# Patient Record
Sex: Male | Born: 1995 | Race: White | Hispanic: No | Marital: Married | Smoking: Never smoker
Health system: Southern US, Community
[De-identification: ages and names within clinical notes are randomized; demographics above are authoritative.]

## PROBLEM LIST (undated history)

## (undated) DIAGNOSIS — J45909 Unspecified asthma, uncomplicated: Secondary | ICD-10-CM

## (undated) DIAGNOSIS — F329 Major depressive disorder, single episode, unspecified: Secondary | ICD-10-CM

## (undated) DIAGNOSIS — F419 Anxiety disorder, unspecified: Secondary | ICD-10-CM

## (undated) DIAGNOSIS — F32A Depression, unspecified: Secondary | ICD-10-CM

## (undated) HISTORY — PX: CIRCUMCISION: SUR203

---
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
## 2017-04-28 ENCOUNTER — Other Ambulatory Visit: Payer: Self-pay

## 2017-04-28 ENCOUNTER — Emergency Department (HOSPITAL_COMMUNITY): Payer: No Typology Code available for payment source

## 2017-04-28 ENCOUNTER — Encounter (HOSPITAL_COMMUNITY): Payer: Self-pay | Admitting: *Deleted

## 2017-04-28 ENCOUNTER — Emergency Department (HOSPITAL_COMMUNITY)
Admission: EM | Admit: 2017-04-28 | Discharge: 2017-04-28 | Disposition: A | Discharge status: Home / Self Care | Payer: No Typology Code available for payment source | Attending: Emergency Medicine | Admitting: Emergency Medicine

## 2017-04-28 DIAGNOSIS — Y929 Unspecified place or not applicable: Secondary | ICD-10-CM | POA: Diagnosis not present

## 2017-04-28 DIAGNOSIS — Y999 Unspecified external cause status: Secondary | ICD-10-CM | POA: Insufficient documentation

## 2017-04-28 DIAGNOSIS — Z79899 Other long term (current) drug therapy: Secondary | ICD-10-CM | POA: Diagnosis not present

## 2017-04-28 DIAGNOSIS — R51 Headache: Secondary | ICD-10-CM | POA: Diagnosis not present

## 2017-04-28 DIAGNOSIS — F419 Anxiety disorder, unspecified: Secondary | ICD-10-CM | POA: Diagnosis not present

## 2017-04-28 DIAGNOSIS — M545 Low back pain: Secondary | ICD-10-CM | POA: Insufficient documentation

## 2017-04-28 DIAGNOSIS — M542 Cervicalgia: Secondary | ICD-10-CM | POA: Diagnosis present

## 2017-04-28 DIAGNOSIS — Y9389 Activity, other specified: Secondary | ICD-10-CM | POA: Diagnosis not present

## 2017-04-28 DIAGNOSIS — R9431 Abnormal electrocardiogram [ECG] [EKG]: Secondary | ICD-10-CM | POA: Diagnosis not present

## 2017-04-28 MED ORDER — METHOCARBAMOL 500 MG PO TABS
750.0000 mg | ORAL_TABLET | Freq: Once | ORAL | Status: AC
  Administered 2017-04-28: 750 mg via ORAL
  Filled 2017-04-28: qty 2

## 2017-04-28 MED ORDER — NAPROXEN 500 MG PO TABS: 500.0000 mg | ORAL_TABLET | Freq: Two times a day (BID) | ORAL | 0 refills | Status: DC

## 2017-04-28 MED ORDER — LORAZEPAM 1 MG PO TABS
1.0000 mg | ORAL_TABLET | Freq: Once | ORAL | Status: AC
  Administered 2017-04-28: 1 mg via ORAL
  Filled 2017-04-28: qty 1

## 2017-04-28 MED ORDER — KETOROLAC TROMETHAMINE 60 MG/2ML IM SOLN
30.0000 mg | Freq: Once | INTRAMUSCULAR | Status: AC
  Administered 2017-04-28: 30 mg via INTRAMUSCULAR
  Filled 2017-04-28: qty 2

## 2017-04-28 MED ORDER — METHOCARBAMOL 500 MG PO TABS: 500.0000 mg | ORAL_TABLET | Freq: Two times a day (BID) | ORAL | 0 refills | Status: DC

## 2017-04-28 NOTE — ED Notes (Signed)
Patient transported to X-Ray 

## 2017-04-28 NOTE — Discharge Instructions (Signed)
Take naprosyn for pain. Robaxin for muscle spasms. Avoid strenuous activity. Follow up with cardiology for abnormal ECG

## 2017-04-28 NOTE — ED Notes (Signed)
Patient transported back from CT 
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
## 2017-05-07 ENCOUNTER — Encounter: Payer: Self-pay | Admitting: Family Medicine

## 2017-05-07 ENCOUNTER — Ambulatory Visit (INDEPENDENT_AMBULATORY_CARE_PROVIDER_SITE_OTHER): Payer: Self-pay | Admitting: Family Medicine

## 2017-05-07 DIAGNOSIS — M542 Cervicalgia: Secondary | ICD-10-CM

## 2017-05-07 DIAGNOSIS — M549 Dorsalgia, unspecified: Secondary | ICD-10-CM
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
## 2017-05-07 NOTE — Patient Instructions (Signed)
Switch to 2 Aleve twice per day when you're Naprosyn runs out. Heat for 20 minutes 3 times per day and then gentle stretching after that

## 2017-05-09 ENCOUNTER — Encounter: Payer: Self-pay | Admitting: Cardiology

## 2017-05-09 ENCOUNTER — Ambulatory Visit (INDEPENDENT_AMBULATORY_CARE_PROVIDER_SITE_OTHER): Payer: Self-pay | Admitting: Cardiology

## 2017-05-09 VITALS — BP 110/60 | HR 76 | Ht 71.0 in | Wt 129.0 lb

## 2017-05-09 DIAGNOSIS — R011 Cardiac murmur, unspecified: Secondary | ICD-10-CM | POA: Insufficient documentation

## 2017-05-09 DIAGNOSIS — R000 Tachycardia, unspecified: Secondary | ICD-10-CM | POA: Insufficient documentation

## 2017-05-09 DIAGNOSIS — S299XXS Unspecified injury of thorax, sequela: Secondary | ICD-10-CM | POA: Insufficient documentation

## 2017-05-09 NOTE — Patient Instructions (Signed)
SCHEDULE AT [address] Your physician has requested that you have an echocardiogram. Echocardiography is a painless test that uses sound waves to create images of your heart. It provides your doctor with information about the size and shape of your heart and how well your heart's chambers and valves are working. This procedure takes approximately one hour. There are no restrictions for this procedure.     Your physician recommends that you schedule a follow-up appointment in 1 MONTH WITH DR [facility]
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
## 2017-05-12 ENCOUNTER — Encounter: Payer: Self-pay | Admitting: Cardiology
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
## 2017-05-23 ENCOUNTER — Ambulatory Visit (HOSPITAL_COMMUNITY): Payer: Self-pay | Attending: Cardiovascular Disease

## 2017-05-23 ENCOUNTER — Other Ambulatory Visit: Payer: Self-pay

## 2017-05-23 DIAGNOSIS — R011 Cardiac murmur, unspecified: Secondary | ICD-10-CM | POA: Insufficient documentation

## 2017-05-23 DIAGNOSIS — X58XXXS Exposure to other specified factors, sequela: Secondary | ICD-10-CM | POA: Insufficient documentation

## 2017-05-23 DIAGNOSIS — R000 Tachycardia, unspecified: Secondary | ICD-10-CM | POA: Insufficient documentation

## 2017-05-23 DIAGNOSIS — S299XXS Unspecified injury of thorax, sequela: Secondary | ICD-10-CM | POA: Insufficient documentation

## 2017-06-14 ENCOUNTER — Ambulatory Visit (INDEPENDENT_AMBULATORY_CARE_PROVIDER_SITE_OTHER): Payer: Self-pay | Admitting: Cardiology

## 2017-06-14 ENCOUNTER — Encounter: Payer: Self-pay | Admitting: Cardiology

## 2017-06-14 VITALS — BP 132/72 | HR 89 | Ht 71.0 in | Wt 125.0 lb

## 2017-06-14 DIAGNOSIS — S299XXS Unspecified injury of thorax, sequela: Secondary | ICD-10-CM

## 2017-06-14 DIAGNOSIS — R011 Cardiac murmur, unspecified: Secondary | ICD-10-CM

## 2017-06-14 DIAGNOSIS — R000 Tachycardia, unspecified: Secondary | ICD-10-CM

## 2017-06-14 NOTE — Progress Notes (Signed)
KEEP HYDRATE

## 2017-06-14 NOTE — Patient Instructions (Signed)
KEEP HYDRATED     IF SYMPTOMS RETURN  CALL OFFICE FOR MONITOR PLACEMENT  AND OFFICE VISIT   Your physician recommends that you schedule a follow-up appointment on an as needed basis.
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
## 2017-06-16 ENCOUNTER — Encounter: Payer: Self-pay | Admitting: Cardiology

## 2017-06-16 NOTE — Assessment & Plan Note (Signed)
No longer tachycardic -- I suspect that he does have some tachycardia related to poor hydration.  Strenuously recommended adequate hydration

## 2017-06-16 NOTE — Assessment & Plan Note (Signed)
Still has some chest wall pain - with deep inspiration. Echo was normal with no sign of cardiac trauma.

## 2017-06-16 NOTE — Assessment & Plan Note (Signed)
Probably a flow murmur, potentially related to dehydration and tachycardia.  Nothing significant found on echo.
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
## 2021-01-11 ENCOUNTER — Emergency Department (HOSPITAL_BASED_OUTPATIENT_CLINIC_OR_DEPARTMENT_OTHER): Payer: Self-pay

## 2021-01-11 ENCOUNTER — Emergency Department (HOSPITAL_BASED_OUTPATIENT_CLINIC_OR_DEPARTMENT_OTHER)
Admission: EM | Admit: 2021-01-11 | Discharge: 2021-01-11 | Disposition: A | Discharge status: Home / Self Care | Payer: Self-pay | Attending: Emergency Medicine | Admitting: Emergency Medicine

## 2021-01-11 ENCOUNTER — Encounter (HOSPITAL_BASED_OUTPATIENT_CLINIC_OR_DEPARTMENT_OTHER): Payer: Self-pay | Admitting: Urology

## 2021-01-11 ENCOUNTER — Other Ambulatory Visit: Payer: Self-pay

## 2021-01-11 DIAGNOSIS — F17200 Nicotine dependence, unspecified, uncomplicated: Secondary | ICD-10-CM | POA: Insufficient documentation

## 2021-01-11 DIAGNOSIS — F1010 Alcohol abuse, uncomplicated: Secondary | ICD-10-CM | POA: Insufficient documentation

## 2021-01-11 DIAGNOSIS — R002 Palpitations: Secondary | ICD-10-CM | POA: Insufficient documentation

## 2021-01-11 DIAGNOSIS — R0789 Other chest pain: Secondary | ICD-10-CM | POA: Insufficient documentation

## 2021-01-11 DIAGNOSIS — Y900 Blood alcohol level of less than 20 mg/100 ml: Secondary | ICD-10-CM | POA: Insufficient documentation

## 2021-01-11 DIAGNOSIS — R0602 Shortness of breath: Secondary | ICD-10-CM | POA: Insufficient documentation

## 2021-01-11 LAB — CBC
HCT: 42.4 % (ref 39.0–52.0)
Hemoglobin: 15.1 g/dL (ref 13.0–17.0)
MCH: 32.6 pg (ref 26.0–34.0)
MCHC: 35.6 g/dL (ref 30.0–36.0)
MCV: 91.6 fL (ref 80.0–100.0)
Platelets: 269 10*3/uL (ref 150–400)
RBC: 4.63 MIL/uL (ref 4.22–5.81)
RDW: 12.3 % (ref 11.5–15.5)
WBC: 6.3 10*3/uL (ref 4.0–10.5)
nRBC: 0 % (ref 0.0–0.2)

## 2021-01-11 LAB — COMPREHENSIVE METABOLIC PANEL
ALT: 16 U/L (ref 0–44)
AST: 19 U/L (ref 15–41)
Albumin: 4 g/dL (ref 3.5–5.0)
Alkaline Phosphatase: 60 U/L (ref 38–126)
Anion gap: 8 (ref 5–15)
BUN: 9 mg/dL (ref 6–20)
CO2: 27 mmol/L (ref 22–32)
Calcium: 9 mg/dL (ref 8.9–10.3)
Chloride: 102 mmol/L (ref 98–111)
Creatinine, Ser: 0.83 mg/dL (ref 0.61–1.24)
GFR, Estimated: >60 mL/min (ref >60)
Glucose, Bld: 107 mg/dL — ABNORMAL HIGH (ref 70–99)
Potassium: 3.4 mmol/L — ABNORMAL LOW (ref 3.5–5.1)
Sodium: 137 mmol/L (ref 135–145)
Total Bilirubin: 0.5 mg/dL (ref 0.3–1.2)
Total Protein: 6.7 g/dL (ref 6.5–8.1)

## 2021-01-11 LAB — TSH: TSH: 1.026 u[IU]/mL (ref 0.350–4.500)

## 2021-01-11 LAB — TROPONIN I (HIGH SENSITIVITY): Troponin I (High Sensitivity): <2 ng/L (ref <18)

## 2021-01-11 LAB — LIPASE, BLOOD: Lipase: 36 U/L (ref 11–51)

## 2021-01-11 LAB — ETHANOL: Alcohol, Ethyl (B): <10 mg/dL (ref <10)

## 2021-01-11 MED ORDER — ALUM & MAG HYDROXIDE-SIMETH 200-200-20 MG/5ML PO SUSP
30.0000 mL | Freq: Once | ORAL | Status: AC
  Administered 2021-01-11: 30 mL via ORAL
  Filled 2021-01-11: qty 30

## 2021-01-11 MED ORDER — FAMOTIDINE 20 MG PO TABS
40.0000 mg | ORAL_TABLET | Freq: Once | ORAL | Status: AC
  Administered 2021-01-11: 40 mg via ORAL
  Filled 2021-01-11: qty 2

## 2021-01-11 MED ORDER — LIDOCAINE VISCOUS HCL 2 % MT SOLN
15.0000 mL | Freq: Once | OROMUCOSAL | Status: AC
  Administered 2021-01-11: 15 mL via ORAL
  Filled 2021-01-11: qty 15

## 2021-01-11 MED ORDER — OMEPRAZOLE 40 MG PO CPDR: 40.0000 mg | DELAYED_RELEASE_CAPSULE | Freq: Every day | ORAL | 0 refills | Status: DC

## 2021-01-11 MED ORDER — ACETAMINOPHEN 500 MG PO TABS
1000.0000 mg | ORAL_TABLET | Freq: Once | ORAL | Status: AC
  Administered 2021-01-11: 1000 mg via ORAL
  Filled 2021-01-11: qty 2
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
## 2021-01-11 NOTE — ED Notes (Signed)
Pt on monitor and vitals cycling 

## 2021-01-11 NOTE — ED Notes (Signed)
Portable Xray at bedside.

## 2021-01-11 NOTE — Discharge Instructions (Addendum)
You were seen in the emergency department for chest pain  Lab work, imaging today did not reveal any abnormalities.  We gave you medicines for your stomach and eventually your pain improved.  The cause of your pain is still unclear.  I do not think this is related to coronary artery disease or primary cardiac problem.  Your pain may be from a GI or stomach cause especially because certain drinks will worsen your pain.  Please start taking 40 mg of omeprazole every morning on an empty stomach 30 to 60 minutes before eating or drinking.  This is an antiacid medicine.  Avoid drinking or eating anything irritating to the stomach such as ibuprofen, aspirin, alcohol, marijuana as these may cause nausea, vomiting and abdominal pain.  Occasionally, acid reflux, gastritis or stomach ulcer those will cause radiating pain into the chest, nausea  Consider cutting back and stopping alcohol consumption.    Please return to ED if: Your chest pain is worse or on exertion You have a cough that gets worse, or you cough up blood. You have severe pain in chest, back or abdomen. You have chest pain with loss of sensation, weakness or tingling in extremities You have chest pain or shortness of breath with exertion or activity You have sudden, unexplained discomfort in your chest, with radiation arms, back, neck, or jaw. You suddenly have chest pain and begin to sweat, or your skin gets clammy. You feel chest pain with nausea or vomiting, blood in vomit You suddenly feel light-headed or faint. Your heart begins to beat quickly, or it feels like it is skipping beats. You have one sided leg swelling or calf pain You have chest pain with fever, chills, cough or other viral symptoms

## 2021-01-11 NOTE — ED Triage Notes (Signed)
Intermittent chest pain and palpitations since this morning.  History of same type of episodes over past month.

## 2021-01-13 ENCOUNTER — Encounter: Payer: Self-pay | Admitting: Family Medicine

## 2021-01-13 ENCOUNTER — Ambulatory Visit (INDEPENDENT_AMBULATORY_CARE_PROVIDER_SITE_OTHER): Payer: Self-pay | Admitting: Family Medicine

## 2021-01-13 ENCOUNTER — Other Ambulatory Visit: Payer: Self-pay

## 2021-01-13 VITALS — BP 140/80 | HR 80 | Resp 18 | Ht 70.0 in | Wt 148.4 lb

## 2021-01-13 DIAGNOSIS — F32A Depression, unspecified: Secondary | ICD-10-CM

## 2021-01-13 DIAGNOSIS — F419 Anxiety disorder, unspecified: Secondary | ICD-10-CM

## 2021-01-13 DIAGNOSIS — F1010 Alcohol abuse, uncomplicated: Secondary | ICD-10-CM

## 2021-01-13 DIAGNOSIS — R10816 Epigastric abdominal tenderness: Secondary | ICD-10-CM

## 2021-01-13 DIAGNOSIS — R1013 Epigastric pain: Secondary | ICD-10-CM

## 2021-01-13 DIAGNOSIS — F1210 Cannabis abuse, uncomplicated: Secondary | ICD-10-CM

## 2021-01-13 DIAGNOSIS — R0789 Other chest pain: Secondary | ICD-10-CM

## 2021-01-13 NOTE — Patient Instructions (Addendum)
You will hear from [facility]   Continue taking omeprazole daily.   Avoid alcohol and stop marijuana.   Call and schedule with a cardiologist as we discussed   Call [facility] for conseling [phone] [address] [place]
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
## 2021-01-14 LAB — COMPREHENSIVE METABOLIC PANEL
ALT: 16 IU/L (ref 0–44)
AST: 15 IU/L (ref 0–40)
Albumin/Globulin Ratio: 1.8 (ref 1.2–2.2)
Albumin: 4.4 g/dL (ref 4.1–5.2)
Alkaline Phosphatase: 88 IU/L (ref 44–121)
BUN/Creatinine Ratio: 9 (ref 9–20)
BUN: 8 mg/dL (ref 6–20)
Bilirubin Total: 0.3 mg/dL (ref 0.0–1.2)
CO2: 25 mmol/L (ref 20–29)
Calcium: 9 mg/dL (ref 8.7–10.2)
Chloride: 102 mmol/L (ref 96–106)
Creatinine, Ser: 0.93 mg/dL (ref 0.76–1.27)
Globulin, Total: 2.5 g/dL (ref 1.5–4.5)
Glucose: 96 mg/dL (ref 65–99)
Potassium: 4.8 mmol/L (ref 3.5–5.2)
Sodium: 138 mmol/L (ref 134–144)
Total Protein: 6.9 g/dL (ref 6.0–8.5)
eGFR: 119 mL/min/{1.73_m2} (ref >59)

## 2021-01-14 LAB — CBC WITH DIFFERENTIAL/PLATELET
Basophils Absolute: 0 10*3/uL (ref 0.0–0.2)
Basos: 0 %
EOS (ABSOLUTE): 0.2 10*3/uL (ref 0.0–0.4)
Eos: 3 %
Hematocrit: 45 % (ref 37.5–51.0)
Hemoglobin: 15.5 g/dL (ref 13.0–17.7)
Immature Grans (Abs): 0 10*3/uL (ref 0.0–0.1)
Immature Granulocytes: 0 %
Lymphocytes Absolute: 2.4 10*3/uL (ref 0.7–3.1)
Lymphs: 37 %
MCH: 32.1 pg (ref 26.6–33.0)
MCHC: 34.4 g/dL (ref 31.5–35.7)
MCV: 93 fL (ref 79–97)
Monocytes Absolute: 0.4 10*3/uL (ref 0.1–0.9)
Monocytes: 6 %
Neutrophils Absolute: 3.5 10*3/uL (ref 1.4–7.0)
Neutrophils: 54 %
Platelets: 282 10*3/uL (ref 150–450)
RBC: 4.83 x10E6/uL (ref 4.14–5.80)
RDW: 12.4 % (ref 11.6–15.4)
WBC: 6.6 10*3/uL (ref 3.4–10.8)

## 2021-01-14 LAB — AMYLASE: Amylase: 58 U/L (ref 31–110)

## 2021-01-14 LAB — LIPASE: Lipase: 31 U/L (ref 13–78)

## 2021-01-31 ENCOUNTER — Other Ambulatory Visit: Payer: Self-pay

## 2021-02-10 ENCOUNTER — Ambulatory Visit: Payer: Self-pay | Admitting: Physician Assistant

## 2021-05-11 ENCOUNTER — Encounter: Payer: Self-pay | Admitting: Family Medicine

## 2021-05-11 ENCOUNTER — Other Ambulatory Visit: Payer: Self-pay

## 2021-05-11 ENCOUNTER — Telehealth (INDEPENDENT_AMBULATORY_CARE_PROVIDER_SITE_OTHER): Payer: Self-pay | Admitting: Family Medicine

## 2021-05-11 VITALS — HR 91 | Ht 70.0 in | Wt 153.0 lb

## 2021-05-11 DIAGNOSIS — H6691 Otitis media, unspecified, right ear: Secondary | ICD-10-CM

## 2021-05-11 MED ORDER — AMOXICILLIN 875 MG PO TABS: 875.0000 mg | ORAL_TABLET | Freq: Two times a day (BID) | ORAL | 0 refills | Status: AC

## 2021-05-11 NOTE — Progress Notes (Signed)
   Subjective:  Documentation for virtual audio and video telecommunications through [place] encounter:  The patient was located at work. 2 patient identifiers used.  The provider was located in the office. The patient did consent to this visit and is aware of possible charges through their insurance for this visit.  The other persons participating in this telemedicine service were none. Time spent on call was 15 minutes and in review of previous records >20 minutes total.  This virtual service is not related to other E/M service within previous 7 days.   Patient ID: Ryan [name], male    DOB: August 19, 1996, 25 y.o.   MRN: [id number]  HPI Chief Complaint  Patient presents with   other    Rt. Ear pain maybe blockage can't really hear out of it. For 1 week, runny nose,    Complains of right ear pain, pressure and feeling clogged for the past week. Also complains of rhinorrhea. Denies fever, chills, headache, dizziness, nasal congestion, sinus pain, postnasal drainage, sore throat, cough. Denies recent swimming or submersion of his ears.  Using at home ear drops which helps some.  He has taken ibuprofen.   Hx of ear infections and tubes as a young child.   Reviewed allergies, medications, past medical, surgical, family, and social history.    Review of Systems Pertinent positives and negatives in the history of present illness.     Objective:   Physical Exam Pulse 91   Ht 5\' 10"  (1.778 m)   Wt 153 lb (69.4 kg)   BMI 21.95 kg/m   In no acute distress.  Respirations unlabored.      Assessment & Plan:  Right acute otitis media - Plan: amoxicillin (AMOXIL) 875 MG tablet  I will treat him for acute otitis media.  Recommend he continue taking ibuprofen or over-the-counter pain medication for the next day or 2.  He may continue with over-the-counter drops as well.  Avoid swimming.  Follow-up if worsening or any new symptoms arise or if he is not back to baseline after  completing the antibiotics.

## 2021-06-02 ENCOUNTER — Other Ambulatory Visit (INDEPENDENT_AMBULATORY_CARE_PROVIDER_SITE_OTHER): Payer: Self-pay

## 2021-06-02 ENCOUNTER — Other Ambulatory Visit: Payer: Self-pay

## 2021-06-02 ENCOUNTER — Encounter: Payer: Self-pay | Admitting: *Deleted

## 2021-06-02 ENCOUNTER — Encounter: Payer: Self-pay | Admitting: Family Medicine

## 2021-06-02 ENCOUNTER — Telehealth (INDEPENDENT_AMBULATORY_CARE_PROVIDER_SITE_OTHER): Payer: Self-pay | Admitting: Family Medicine

## 2021-06-02 VITALS — Temp 101.1°F | Ht 71.0 in | Wt 150.0 lb

## 2021-06-02 DIAGNOSIS — R062 Wheezing: Secondary | ICD-10-CM

## 2021-06-02 DIAGNOSIS — U071 COVID-19: Secondary | ICD-10-CM

## 2021-06-02 LAB — POCT INFLUENZA A/B
Influenza A, POC: NEGATIVE
Influenza B, POC: NEGATIVE

## 2021-06-02 LAB — POC COVID19 BINAXNOW: SARS Coronavirus 2 Ag: POSITIVE — AB

## 2021-06-02 MED ORDER — ALBUTEROL SULFATE HFA 108 (90 BASE) MCG/ACT IN AERS: 2.0000 | INHALATION_SPRAY | Freq: Four times a day (QID) | RESPIRATORY_TRACT | 0 refills | Status: DC | PRN

## 2021-06-02 MED ORDER — NIRMATRELVIR/RITONAVIR (PAXLOVID)TABLET: 3.0000 | ORAL_TABLET | Freq: Two times a day (BID) | ORAL | 0 refills | Status: AC

## 2021-06-02 NOTE — Progress Notes (Signed)
Start time: 2:52 End time: 3:14  Virtual Visit via Video Note  I connected with Danise Mina on 06/02/21 by a video enabled telemedicine application and verified that I am speaking with the correct person using two identifiers.  Location: Patient: outside in his driveway Provider: office   I discussed the limitations of evaluation and management by telemedicine and the availability of in person appointments. The patient expressed understanding and agreed to proceed.  History of Present Illness:  Chief Complaint  Patient presents with   Cough    VIRTUAL cough, ST and runny nose. Fever over the last two days-up and down. Body aches. All other symptoms started about 5 days ago. No covid tests have been taken.   5 days ago started with scratchy throat, cough, runny nose In the last couple of days he hs gotten worse-- He noticed at times that he couldn't feel his fingertips or toes He feels very drained when he moves, no energy.  Just started checking temp yesterday, was 101.7. Had felt hot prior to that, about 3-4 days ago. +headaches at forehead and R side of his head.  Nasal drainage is clear. Coughs up black, some yellow mixed in, thick. Thinks the black may come from his daily marijuana smoking.  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 Feels tight in the mornings, like when he had asthma as a child.  Smokes marijuana, daily--not as much in the last couple of days, makes his breathing worse.  No h/o seasonal allergies.  +sick contacts at work, flu and COVID. Not wearing masks at work.  He has taken tylenol for headache, no other OTC medications.  He hasn't had any COVID vaccines  PMH, PSH, SH reviewed H/o childhood asthma  Outpatient Encounter Medications as of 06/02/2021  Medication Sig   omeprazole (PRILOSEC) 20 MG capsule Take 20 mg by mouth daily.   [DISCONTINUED] chlorhexidine (PERIDEX) 0.12 %  solution SMARTSIG:By Mouth (Patient not taking: No sig reported)   [DISCONTINUED] omeprazole (PRILOSEC) 40 MG capsule Take 1 capsule (40 mg total) by mouth daily. Take every morning on an empty stomach 30-60 min before eating or drinking   No facility-administered encounter medications on file as of 06/02/2021.   No Known Allergies  ROS:  fever, URI symptoms per HPI. No vomiting or diarrhea, some nausea. No rashes. No loss of taste/smell. Some chest pain--ongoing and evaluated in the past, not new.   Observations/Objective:  Temp (!) 101.1 F (38.4 C) (Temporal)   Ht 5\' 11"  (1.803 m)   Wt 150 lb (68 kg)   BMI 20.92 kg/m   Well-appearing male, speaking comfortably, in no distress. No coughing during visit. He is alert, oriented. Exam is limited due to virtual nature of the visit.  COVID rapid test +  Assessment and Plan:  COVID-19 virus infection - unvaccinated, h/o childhood asthma with some wheezing. Treat with paxlovid - Plan: nirmatrelvir/ritonavir EUA (PAXLOVID) 20 x 150 MG & 10 x 100MG  TABS  Wheezing - Plan: albuterol (VENTOLIN HFA) 108 (90 Base) MCG/ACT inhaler  Work note for today was given when he came for test.  Add'l note to be written for a week off after + COVID test back. Reviewed supportive care, and to f/u if sx persist/worsen.   Follow Up Instructions:    I discussed the assessment and treatment plan with the patient. The patient was provided an opportunity to ask questions and all were answered. The  patient agreed with the plan and demonstrated an understanding of the instructions.   The patient was advised to call back or seek an in-person evaluation if the symptoms worsen or if the condition fails to improve as anticipated.  I spent 24 minutes dedicated to the care of this patient, including pre-visit review of records, face to face time, post-visit ordering of testing and documentation.    Lavonda Jumbo, MD

## 2021-06-02 NOTE — Patient Instructions (Signed)
Your COVID test was positive. Take the paxlovid twice daily for 5 days.  Start today, since you are supposed to start it within 5 days of the onset of symptoms.  Drink plenty of fluid (water). Continue tylenol or ibuprofen as needed for fever and pain. Use Mucinex DM or Robitussin DM as needed for cough and chest congestion Use the albuterol inhaler every 4-6 hours if needed for wheezing or shortness of breath.   Please contact us if you are needing this very frequently, and not improving, or other new symptoms or concerns develop.

## 2021-06-10 ENCOUNTER — Encounter: Payer: Self-pay | Admitting: Family Medicine

## 2021-06-10 ENCOUNTER — Telehealth: Payer: Self-pay

## 2021-06-10 NOTE — Telephone Encounter (Signed)
Pt. Emailed note to be out of work until Monday and he was told to call back Monday if he is still having symptoms to scheduled virtual apt if needed.

## 2021-06-10 NOTE — Telephone Encounter (Signed)
Pt. Called stating that his note to return to work had him out a week. He stated that you told him he needed to be out 10 days if he was still having symptoms. He said he still has a temperature between 100-101 cough and congestion. He wanted to know if he could get an updated note stating he could be out of work the full 10 days since he still has symptoms. He said his boss was trying to fire him because he did not come in today.

## 2021-06-15 ENCOUNTER — Telehealth (INDEPENDENT_AMBULATORY_CARE_PROVIDER_SITE_OTHER): Payer: Self-pay | Admitting: Family Medicine

## 2021-06-15 ENCOUNTER — Other Ambulatory Visit: Payer: Self-pay

## 2021-06-15 ENCOUNTER — Encounter: Payer: Self-pay | Admitting: Family Medicine

## 2021-06-15 VITALS — Temp 100.6°F | Wt 150.0 lb

## 2021-06-15 DIAGNOSIS — U071 COVID-19: Secondary | ICD-10-CM

## 2021-06-15 DIAGNOSIS — J40 Bronchitis, not specified as acute or chronic: Secondary | ICD-10-CM

## 2021-06-15 MED ORDER — AZITHROMYCIN 500 MG PO TABS: 500.0000 mg | ORAL_TABLET | Freq: Every day | ORAL | 0 refills | Status: DC

## 2021-06-15 NOTE — Progress Notes (Signed)
   Subjective:    Patient ID: Ryan [name], male    DOB: 12/18/1995, 25 y.o.   MRN: [id number]  HPI Documentation for virtual audio and video telecommunications through [place] encounter:  The patient was located at home. 2 patient identifiers used.  The provider was located in the office. The patient did consent to this visit and is aware of possible charges through their insurance for this visit. The other persons participating in this telemedicine service were none. Time spent on call was 5 minutes and in review of previous records >15 minutes total for counseling and coordination of care. This virtual service is not related to other E/M service within previous 7 days.  He was diagnosed with COVID last week and was seen with a virtual visit on 922.  That note was reviewed .  He continues have difficulty with cough, sore throat, headache and fever most recently to 100.6.  The coughing is productive.  He has not taken anything for this.  Review of Systems     Objective:   Physical Exam Alert and in no distress but does look slightly fatigued.       Assessment & Plan:  Bronchitis - Plan: azithromycin (ZITHROMAX) 500 MG tablet  COVID-19 virus infection I explained that the antibiotic works for at least a week.  I will give him a note for him to return to work on Tuesday however I want him to call me Monday to ensure that he is indeed feeling better.  Explained that we might need to give another Dosepak of the azithromycin if he still having trouble after we

## 2021-11-01 ENCOUNTER — Telehealth: Payer: Self-pay | Admitting: Family Medicine

## 2021-11-01 NOTE — Telephone Encounter (Signed)
Dismissal letter in guarantor snapshot  °

## 2022-02-19 ENCOUNTER — Other Ambulatory Visit: Payer: Self-pay

## 2022-02-19 ENCOUNTER — Emergency Department (HOSPITAL_COMMUNITY): Payer: Self-pay

## 2022-02-19 ENCOUNTER — Emergency Department (HOSPITAL_BASED_OUTPATIENT_CLINIC_OR_DEPARTMENT_OTHER)
Admission: EM | Admit: 2022-02-19 | Discharge: 2022-02-19 | Disposition: A | Discharge status: Home / Self Care | Payer: Self-pay | Attending: Emergency Medicine | Admitting: Emergency Medicine

## 2022-02-19 ENCOUNTER — Encounter (HOSPITAL_BASED_OUTPATIENT_CLINIC_OR_DEPARTMENT_OTHER): Payer: Self-pay | Admitting: Obstetrics and Gynecology

## 2022-02-19 DIAGNOSIS — H538 Other visual disturbances: Secondary | ICD-10-CM | POA: Insufficient documentation

## 2022-02-19 DIAGNOSIS — R519 Headache, unspecified: Secondary | ICD-10-CM | POA: Insufficient documentation

## 2022-02-19 DIAGNOSIS — R200 Anesthesia of skin: Secondary | ICD-10-CM | POA: Insufficient documentation

## 2022-02-19 DIAGNOSIS — R202 Paresthesia of skin: Secondary | ICD-10-CM | POA: Insufficient documentation

## 2022-02-19 HISTORY — DX: Unspecified asthma, uncomplicated: J45.909

## 2022-02-19 LAB — CBC WITH DIFFERENTIAL/PLATELET
Abs Immature Granulocytes: 0.01 10*3/uL (ref 0.00–0.07)
Basophils Absolute: 0 10*3/uL (ref 0.0–0.1)
Basophils Relative: 0 %
Eosinophils Absolute: 0.1 10*3/uL (ref 0.0–0.5)
Eosinophils Relative: 2 %
HCT: 43.5 % (ref 39.0–52.0)
Hemoglobin: 15 g/dL (ref 13.0–17.0)
Immature Granulocytes: 0 %
Lymphocytes Relative: 30 %
Lymphs Abs: 2 10*3/uL (ref 0.7–4.0)
MCH: 31.5 pg (ref 26.0–34.0)
MCHC: 34.5 g/dL (ref 30.0–36.0)
MCV: 91.4 fL (ref 80.0–100.0)
Monocytes Absolute: 0.6 10*3/uL (ref 0.1–1.0)
Monocytes Relative: 8 %
Neutro Abs: 4 10*3/uL (ref 1.7–7.7)
Neutrophils Relative %: 60 %
Platelets: 254 10*3/uL (ref 150–400)
RBC: 4.76 MIL/uL (ref 4.22–5.81)
RDW: 12.4 % (ref 11.5–15.5)
WBC: 6.7 10*3/uL (ref 4.0–10.5)
nRBC: 0 % (ref 0.0–0.2)

## 2022-02-19 LAB — BASIC METABOLIC PANEL
Anion gap: 9 (ref 5–15)
BUN: 8 mg/dL (ref 6–20)
CO2: 28 mmol/L (ref 22–32)
Calcium: 10 mg/dL (ref 8.9–10.3)
Chloride: 104 mmol/L (ref 98–111)
Creatinine, Ser: 0.8 mg/dL (ref 0.61–1.24)
GFR, Estimated: >60 mL/min (ref >60)
Glucose, Bld: 99 mg/dL (ref 70–99)
Potassium: 4.2 mmol/L (ref 3.5–5.1)
Sodium: 141 mmol/L (ref 135–145)

## 2022-02-19 MED ORDER — GADOBUTROL 1 MMOL/ML IV SOLN
6.5000 mL | Freq: Once | INTRAVENOUS | Status: AC | PRN
  Administered 2022-02-19: 6.5 mL via INTRAVENOUS

## 2022-02-19 MED ORDER — ONDANSETRON HCL 4 MG/2ML IJ SOLN
4.0000 mg | Freq: Once | INTRAMUSCULAR | Status: AC
  Administered 2022-02-19: 4 mg via INTRAVENOUS
  Filled 2022-02-19: qty 2

## 2022-02-19 MED ORDER — SODIUM CHLORIDE 0.9 % IV BOLUS
1000.0000 mL | Freq: Once | INTRAVENOUS | Status: AC
  Administered 2022-02-19: 1000 mL via INTRAVENOUS

## 2022-02-19 NOTE — ED Provider Notes (Signed)
Ryan [name]   CSN: [id number] Arrival date & time: 02/19/22  [name]     History  Chief Complaint  Patient presents with   Blurred Vision    Ryan [name] is a 26 y.o. male.  Presented to the emergency department due to concern for multiple different complaints.  Patient states around 9 PM he started having numb/tingling sensation in his body, primarily in his lower legs, starting from around his knee down on both sides as well as both of his hands.  This seems to be relatively constant.  Has had generalized decreased energy levels but no focal weakness anywhere.  He also states that he has had periodic episodes of blurry vision.  States that he feels like everything in his vision gets blurry except what he is focused on, this lasts for a couple of minutes and then resolves.  He currently does not have any visual complaints at this time.  He has had a mild headache.  Not worst headache of his life and the headache was not sudden onset.  He denies any chills or fevers no neck pain or stiffness.  HPI     Home Medications Prior to Admission medications   Medication Sig Start Date End Date Taking? Authorizing Provider  albuterol (VENTOLIN HFA) 108 (90 Base) MCG/ACT inhaler Inhale 2 puffs into the lungs every 6 (six) hours as needed for wheezing or shortness of breath. 06/02/21   Rita Ohara, MD  azithromycin (ZITHROMAX) 500 MG tablet Take 1 tablet (500 mg total) by mouth daily. 06/15/21   Denita Lung, MD  omeprazole (PRILOSEC) 20 MG capsule Take 20 mg by mouth daily.    [provider]      Allergies    Patient has no known allergies.    Review of Systems   Review of Systems  Constitutional:  Negative for chills and fever.  HENT:  Negative for ear pain and sore throat.   Eyes:  Negative for pain and visual disturbance.  Respiratory:  Negative for cough and shortness of breath.   Cardiovascular:  Negative for chest pain and  palpitations.  Gastrointestinal:  Negative for abdominal pain and vomiting.  Genitourinary:  Negative for dysuria and hematuria.  Musculoskeletal:  Negative for arthralgias and back pain.  Skin:  Negative for color change and rash.  Neurological:  Positive for numbness and headaches. Negative for seizures and syncope.  All other systems reviewed and are negative.   Physical Exam Updated Vital Signs BP 139/85   Pulse 77   Temp 97.8 F (36.6 C) (Oral)   Resp 16   Ht 5\' 10"  (1.778 m)   Wt 61.2 kg   SpO2 100%   BMI 19.37 kg/m  Physical Exam Vitals and nursing [name] reviewed.  Constitutional:      General: He is not in acute distress.    Appearance: He is well-developed.  HENT:     Head: Normocephalic and atraumatic.  Eyes:     Conjunctiva/sclera: Conjunctivae normal.  Cardiovascular:     Rate and Rhythm: Normal rate and regular rhythm.     Heart sounds: No murmur heard. Pulmonary:     Effort: Pulmonary effort is normal. No respiratory distress.     Breath sounds: Normal breath sounds.  Abdominal:     Palpations: Abdomen is soft.     Tenderness: There is no abdominal tenderness.  Musculoskeletal:        General: No swelling.     Cervical back:  Neck supple.  Skin:    General: Skin is warm and dry.     Capillary Refill: Capillary refill takes less than 2 seconds.  Neurological:     Mental Status: He is alert.     Comments: AAOx3 CN 2-12 intact, speech clear visual fields intact 5/5 strength in b/l UE and LE Patient is able to sense light touch throughout all of his extremities.  He states that it feels slightly different when light touch is applied to both of his lower legs Normal FNF Normal gait Patellar reflexes bilaterally are normal  Psychiatric:        Mood and Affect: Mood normal.     ED Results / Procedures / Treatments   Labs (all labs ordered are listed, but only abnormal results are displayed) Labs Reviewed  CBC WITH DIFFERENTIAL/PLATELET  BASIC  METABOLIC PANEL    EKG None  Radiology No results found.  Procedures Procedures    Medications Ordered in ED Medications  sodium chloride 0.9 % bolus 1,000 mL (1,000 mLs Intravenous New Bag/Given 02/19/22 0941)    ED Course/ Medical Decision Making/ A&P                           Medical Decision Making Amount and/or Complexity of Data Reviewed Labs: ordered. Radiology: ordered.   26 year old man presents to ER for numb/tingling sensation in his lower legs, hands and periodic vision changes.  Currently no visual complaints, his neurologic exam was normal except he did state that sensation to light touch seemed slightly different in his lower legs.  I discussed patient's presentation with Dr. Parke Simmers on call for neuro to get thoughts regarding workup choice -she recommended checking electrolytes first and correcting any electrolyte abnormalities.  If patient continuing to complain of these sensory changes then could consider MRI C-spine with and without contrast to check for atypical MS type presentation.  If symptoms resolving then advanced imaging not needed.  Check basic labs, provided patient with some fluids.  His labs are normal, he has no electrolyte derangement.  Reassessed patient, he denies any improvement in his symptoms, still experiencing the numb/tingling sensation in both of his legs and hands/upper arms.  I discussed the case with Dr. Dennis Bast at [facility].  He will accept as transfer ER to ER for MRI.  Patient's wife at bedside, can transport patient.  Given patient's well appearance, feel he is stable to go POV at this time.  Instructed to go directly to [facility] ER.        Final Clinical Impression(s) / ED Diagnoses Final diagnoses:  Numbness and tingling in both hands  Numbness and tingling of both legs    Rx / DC Orders ED Discharge Orders     None         Lucrezia Starch, MD 02/19/22 1035

## 2022-02-19 NOTE — ED Provider Notes (Signed)
   Patient seen after prior EDP  MR imaging obtained is without significant acute pathology  Case discussed again with neurology.  Dr. Tempie Hoist feels the patient would be best served with outpatient neurology follow-up.  Patient is reassured by results of ED evaluation and work-up.  He does understand need for close outpatient follow-up.  Strict return precautions given and understood   Wynetta Fines, MD 02/19/22 2030

## 2022-02-19 NOTE — Discharge Instructions (Signed)
Return for any problem.  ?

## 2022-02-19 NOTE — ED Triage Notes (Signed)
Patient reports to the ER for blurred vision since 9pm last night. Patient reports his vision will go in and out and when his vision returns he has black spots. Patient reports he also feels like his body is "like static". Patient reports he has been having headaches that are worse that normal.

## 2022-02-19 NOTE — ED Notes (Signed)
Pt transported to MRI 

## 2022-02-19 NOTE — ED Notes (Signed)
Pt departs ED. Pt AxOx4. VSS. Significant other and patient verbalized that they will drive directly to [facility].

## 2022-05-25 ENCOUNTER — Encounter (HOSPITAL_BASED_OUTPATIENT_CLINIC_OR_DEPARTMENT_OTHER): Payer: Self-pay | Admitting: Emergency Medicine

## 2022-05-25 ENCOUNTER — Emergency Department (HOSPITAL_BASED_OUTPATIENT_CLINIC_OR_DEPARTMENT_OTHER)
Admission: EM | Admit: 2022-05-25 | Discharge: 2022-05-25 | Disposition: A | Discharge status: Home / Self Care | Payer: Self-pay | Attending: Emergency Medicine | Admitting: Emergency Medicine

## 2022-05-25 ENCOUNTER — Emergency Department (HOSPITAL_BASED_OUTPATIENT_CLINIC_OR_DEPARTMENT_OTHER): Payer: Self-pay

## 2022-05-25 DIAGNOSIS — J209 Acute bronchitis, unspecified: Secondary | ICD-10-CM | POA: Insufficient documentation

## 2022-05-25 DIAGNOSIS — Z20822 Contact with and (suspected) exposure to covid-19: Secondary | ICD-10-CM | POA: Insufficient documentation

## 2022-05-25 DIAGNOSIS — J40 Bronchitis, not specified as acute or chronic: Secondary | ICD-10-CM

## 2022-05-25 LAB — RESP PANEL BY RT-PCR (FLU A&B, COVID) ARPGX2
Influenza A by PCR: NEGATIVE
Influenza B by PCR: NEGATIVE
SARS Coronavirus 2 by RT PCR: NEGATIVE

## 2022-05-25 MED ORDER — AZITHROMYCIN 500 MG PO TABS: 500.0000 mg | ORAL_TABLET | Freq: Every day | ORAL | 0 refills | Status: DC

## 2022-05-25 NOTE — Discharge Instructions (Addendum)
You were seen in the emergency department for congestion and cough fatigue.  Your chest x-ray did not show any pneumonia and your COVID and flu test were negative.  We are treating you with an antibiotic for possible bronchitis.  Please drink plenty of fluids and rest.  Return to the emergency department if any worsening or concerning symptoms

## 2022-05-25 NOTE — ED Provider Notes (Signed)
[facility] EMERGENCY DEPARTMENT Provider Note   CSN: [id number] Arrival date & time: 05/25/22  0746     History  Chief Complaint  Patient presents with   Cough    Ryan [name] is a 26 y.o. male.  He has no significant past medical history.  He has been sick with cough congestion fatigue starting 2 days ago.  He is not sure if he has had a fever.  He has tried nothing for his symptoms.  His daughter was sick a few days ago.  He was also at the beach before that.  He said he was sent home from work and he is unable to return unless he has a note.  Smokes marijuana denies tobacco.  No nausea vomiting diarrhea.  The history is provided by the patient.  Cough Cough characteristics:  Productive Sputum characteristics:  Yellow Severity:  Moderate Onset quality:  Gradual Duration:  2 days Timing:  Intermittent Progression:  Unchanged Chronicity:  New Smoker: yes   Context: sick contacts   Relieved by:  None tried Worsened by:  Nothing Ineffective treatments:  None tried Associated symptoms: rhinorrhea   Associated symptoms: no chest pain, no ear pain, no fever, no rash and no wheezing   Risk factors: recent travel        Home Medications Prior to Admission medications   Medication Sig Start Date End Date Taking? Authorizing Provider  albuterol (VENTOLIN HFA) 108 (90 Base) MCG/ACT inhaler Inhale 2 puffs into the lungs every 6 (six) hours as needed for wheezing or shortness of breath. 06/02/21   Joselyn Arrow, MD  azithromycin (ZITHROMAX) 500 MG tablet Take 1 tablet (500 mg total) by mouth daily. 06/15/21   Ronnald Nian, MD  omeprazole (PRILOSEC) 20 MG capsule Take 20 mg by mouth daily.    [provider]      Allergies    Patient has no known allergies.    Review of Systems   Review of Systems  Constitutional:  Positive for fatigue. Negative for fever.  HENT:  Positive for rhinorrhea. Negative for ear pain.   Eyes:  Negative for visual  disturbance.  Respiratory:  Positive for cough. Negative for wheezing.   Cardiovascular:  Negative for chest pain.  Gastrointestinal:  Negative for nausea and vomiting.  Skin:  Negative for rash.    Physical Exam Updated Vital Signs BP (!) 136/90 (BP Location: Right Arm)   Pulse 79   Temp 97.7 F (36.5 C) (Oral)   Resp 18   Ht 5\' 10"  (1.778 m)   Wt 65.8 kg   SpO2 100%   BMI 20.81 kg/m  Physical Exam Vitals and nursing note reviewed.  Constitutional:      General: He is not in acute distress.    Appearance: Normal appearance. He is well-developed.  HENT:     Head: Normocephalic and atraumatic.     Right Ear: Tympanic membrane normal.     Left Ear: Tympanic membrane normal.     Nose: Rhinorrhea present.     Mouth/Throat:     Mouth: Mucous membranes are moist.     Pharynx: Oropharynx is clear. No oropharyngeal exudate.  Eyes:     Conjunctiva/sclera: Conjunctivae normal.  Cardiovascular:     Rate and Rhythm: Normal rate and regular rhythm.     Heart sounds: No murmur heard. Pulmonary:     Effort: Pulmonary effort is normal. No respiratory distress.     Breath sounds: Normal breath sounds.  Abdominal:  Palpations: Abdomen is soft.     Tenderness: There is no abdominal tenderness.  Musculoskeletal:     Cervical back: Neck supple.     Right lower leg: No edema.     Left lower leg: No edema.  Skin:    General: Skin is warm and dry.     Capillary Refill: Capillary refill takes less than 2 seconds.  Neurological:     General: No focal deficit present.     Mental Status: He is alert.     ED Results / Procedures / Treatments   Labs (all labs ordered are listed, but only abnormal results are displayed) Labs Reviewed  RESP PANEL BY RT-PCR (FLU A&B, COVID) [id number]    EKG None  Radiology DG Chest Port 1 View  Result Date: 05/25/2022 CLINICAL DATA:  Cough, congestion EXAM: PORTABLE CHEST 1 VIEW COMPARISON:  01/11/2021 FINDINGS: The heart size and mediastinal  contours are within normal limits. Both lungs are clear. The visualized skeletal structures are unremarkable. IMPRESSION: No active disease. Electronically Signed   By: Elige Ko M.D.   On: 05/25/2022 08:23    Procedures Procedures    Medications Ordered in ED Medications - No data to display  ED Course/ Medical Decision Making/ A&P Clinical Course as of 05/25/22 0940  Thu May 25, 2022  0827 Chest x-ray interpreted by me as no acute infiltrates.  Awaiting radiology reading. [MB]    Clinical Course User Index [MB] Terrilee Files, MD                           Medical Decision Making Amount and/or Complexity of Data Reviewed Radiology: ordered.  Risk Prescription drug management.  Ryan [name] was evaluated in Emergency Department on 05/25/2022 for the symptoms described in the history of present illness. He was evaluated in the context of the global COVID-19 pandemic, which necessitated consideration that the patient might be at risk for infection with the SARS-CoV-2 virus that causes COVID-19. Institutional protocols and algorithms that pertain to the evaluation of patients at risk for COVID-19 are in a state of rapid change based on information released by regulatory bodies including the CDC and federal and state organizations. These policies and algorithms were followed during the patient's care in the ED. 26 year old male here with cough fatigue congestion.  Lungs sound clear and saturations 100% on room air.  Afebrile here.  Clinically well-appearing.  COVID and flu testing negative and chest x-ray does not show any acute infiltrates.  Differential includes COVID, flu, bronchitis, pneumonia, viral syndrome.  Will cover with antibiotics for possible bacterial bronchitis.  Return instructions discussed.         Final Clinical Impression(s) / ED Diagnoses Final diagnoses:  Acute bronchitis, unspecified organism    Rx / DC Orders ED Discharge Orders           Ordered    azithromycin (ZITHROMAX) 500 MG tablet  Daily        05/25/22 0846              Terrilee Files, MD 05/25/22 [phone]

## 2022-05-25 NOTE — ED Triage Notes (Signed)
Having a cough, congestion and runny nose, having prod cough of sputum of clear to dark [place], thick sputum. Onset this past Tuesday.

## 2022-10-04 ENCOUNTER — Ambulatory Visit
Admission: EM | Admit: 2022-10-04 | Discharge: 2022-10-04 | Disposition: A | Discharge status: Home / Self Care | Payer: Medicaid Other | Attending: Urgent Care | Admitting: Urgent Care

## 2022-10-04 ENCOUNTER — Ambulatory Visit (INDEPENDENT_AMBULATORY_CARE_PROVIDER_SITE_OTHER): Payer: Medicaid Other

## 2022-10-04 DIAGNOSIS — M542 Cervicalgia: Secondary | ICD-10-CM | POA: Diagnosis not present

## 2022-10-04 DIAGNOSIS — R0782 Intercostal pain: Secondary | ICD-10-CM

## 2022-10-04 DIAGNOSIS — R0781 Pleurodynia: Secondary | ICD-10-CM | POA: Diagnosis not present

## 2022-10-04 DIAGNOSIS — M546 Pain in thoracic spine: Secondary | ICD-10-CM | POA: Diagnosis not present

## 2022-10-04 MED ORDER — TIZANIDINE HCL 4 MG PO TABS: 4.0000 mg | ORAL_TABLET | Freq: Every day | ORAL | 0 refills | Status: DC

## 2022-10-04 MED ORDER — NAPROXEN 500 MG PO TABS: 500.0000 mg | ORAL_TABLET | Freq: Two times a day (BID) | ORAL | 0 refills | Status: DC
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
## 2022-12-06 ENCOUNTER — Ambulatory Visit
Admission: EM | Admit: 2022-12-06 | Discharge: 2022-12-06 | Disposition: A | Discharge status: Home / Self Care | Payer: Medicaid Other | Attending: Internal Medicine | Admitting: Internal Medicine

## 2022-12-06 ENCOUNTER — Ambulatory Visit (INDEPENDENT_AMBULATORY_CARE_PROVIDER_SITE_OTHER): Payer: Medicaid Other

## 2022-12-06 ENCOUNTER — Encounter: Payer: Self-pay | Admitting: Emergency Medicine

## 2022-12-06 DIAGNOSIS — J0110 Acute frontal sinusitis, unspecified: Secondary | ICD-10-CM

## 2022-12-06 DIAGNOSIS — R051 Acute cough: Secondary | ICD-10-CM

## 2022-12-06 DIAGNOSIS — J029 Acute pharyngitis, unspecified: Secondary | ICD-10-CM | POA: Diagnosis not present

## 2022-12-06 LAB — POCT RAPID STREP A (OFFICE): Rapid Strep A Screen: NEGATIVE

## 2022-12-06 LAB — POCT INFLUENZA A/B
Influenza A, POC: NEGATIVE
Influenza B, POC: NEGATIVE

## 2022-12-06 MED ORDER — FLUTICASONE PROPIONATE 50 MCG/ACT NA SUSP: 1.0000 | Freq: Every day | NASAL | 0 refills | Status: DC

## 2022-12-06 MED ORDER — ACETAMINOPHEN 325 MG PO TABS
650.0000 mg | ORAL_TABLET | Freq: Once | ORAL | Status: AC
  Administered 2022-12-06: 650 mg via ORAL

## 2022-12-06 MED ORDER — AMOXICILLIN-POT CLAVULANATE 875-125 MG PO TABS: 1.0000 | ORAL_TABLET | Freq: Two times a day (BID) | ORAL | 0 refills | Status: AC

## 2022-12-06 MED ORDER — BENZONATATE 200 MG PO CAPS: 200.0000 mg | ORAL_CAPSULE | Freq: Three times a day (TID) | ORAL | 0 refills | Status: DC | PRN

## 2022-12-06 NOTE — ED Provider Notes (Signed)
[facility]    CSN: [id number] Arrival date & time: 12/06/22  1616      History   Chief Complaint Chief Complaint  Patient presents with   Fever   Dizziness   Sore Throat   Cough   Generalized Body Aches    HPI Ryan [name] is a 27 y.o. male  presents for evaluation of URI symptoms for 5 days. Patient reports associated symptoms of subjective fevers, cough, body aches, sore throat, headache, dizziness, nausea and vomiting. Denies N/V/D, shortness of breath, rashes. Patient does have a hx of asthma as a child but outgrew it.  He quit smoking marijuana last month.  No known sick contacts.  Pt has taken cold/cough medicine OTC for symptoms. Pt has no other concerns at this time.    Fever Associated symptoms: congestion, cough, headaches, myalgias, nausea, sore throat and vomiting   Dizziness Associated symptoms: headaches, nausea and vomiting   Sore Throat Associated symptoms include headaches.  Cough Associated symptoms: fever, headaches, myalgias and sore throat     Past Medical History:  Diagnosis Date   Anxiety    Asthma    Depression     Patient Active Problem List   Diagnosis Date Noted   Chest wall pain 01/13/2021   Epigastric abdominal pain 01/13/2021   Epigastric abdominal tenderness without rebound tenderness 01/13/2021   Alcohol abuse 01/13/2021   Marijuana abuse 01/13/2021   Anxiety and depression [id number]   Systolic murmur [id number]   Sinus tachycardia by electrocardiogram 05/09/2017   Chest wall trauma, sequela 05/09/2017    Past Surgical History:  Procedure Laterality Date   CIRCUMCISION  1999       Home Medications    Prior to Admission medications   Medication Sig Start Date End Date Taking? Authorizing Provider  amoxicillin-clavulanate (AUGMENTIN) 875-125 MG tablet Take 1 tablet by mouth every 12 (twelve) hours for 10 days. 12/06/22 12/16/22 Yes Melynda Ripple, NP  benzonatate (TESSALON) 200 MG capsule Take 1 capsule  (200 mg total) by mouth 3 (three) times daily as needed for cough. 12/06/22  Yes Melynda Ripple, NP  fluticasone (FLONASE) 50 MCG/ACT nasal spray Place 1 spray into both nostrils daily. 12/06/22  Yes Melynda Ripple, NP  naproxen (NAPROSYN) 500 MG tablet Take 1 tablet (500 mg total) by mouth 2 (two) times daily with a meal. 10/04/22   Jaynee Eagles, PA-C  tiZANidine (ZANAFLEX) 4 MG tablet Take 1 tablet (4 mg total) by mouth at bedtime. 10/04/22   Jaynee Eagles, PA-C    Family History Family History  Problem Relation Age of Onset   Heart murmur Mother    Clotting disorder Father    Other Maternal Grandmother        Died at 12 due to blood clots   Diabetes Maternal Grandmother    Clotting disorder Paternal Grandfather        blood clot   Heart failure Paternal Grandfather    Heart attack Paternal Aunt     Social History Social History   Tobacco Use   Smoking status: Never    Passive exposure: Past   Smokeless tobacco: Never   Tobacco comments:    Marijuana  Vaping Use   Vaping Use: Never used  Substance Use Topics   Alcohol use: Yes    Comment: daily   Drug use: Yes    Types: Marijuana     Allergies   Patient has no known allergies.   Review of Systems Review of  Systems  Constitutional:  Positive for fever.  HENT:  Positive for congestion and sore throat.   Respiratory:  Positive for cough.   Gastrointestinal:  Positive for nausea and vomiting.  Musculoskeletal:  Positive for myalgias.  Neurological:  Positive for dizziness and headaches.     Physical Exam Triage Vital Signs ED Triage Vitals  Enc Vitals Group     BP 12/06/22 1636 131/78     Pulse Rate 12/06/22 1636 (!) 106     Resp 12/06/22 1636 18     Temp 12/06/22 1636 (!) 102.8 F (39.3 C)     Temp Source 12/06/22 1636 Oral     SpO2 12/06/22 1636 96 %     Weight --      Height --      Head Circumference --      Peak Flow --      Pain Score 12/06/22 1635 9     Pain Loc --      Pain Edu? --      Excl. in  [place]? --    No data found.  Updated Vital Signs BP 131/78 (BP Location: Right Arm)   Pulse (!) 106   Temp (!) 102.8 F (39.3 C) (Oral)   Resp 18   SpO2 96%   Visual Acuity Right Eye Distance:   Left Eye Distance:   Bilateral Distance:    Right Eye Near:   Left Eye Near:    Bilateral Near:     Physical Exam Vitals and nursing note reviewed.  Constitutional:      General: He is not in acute distress.    Appearance: Normal appearance. He is not ill-appearing or toxic-appearing.  HENT:     Head: Normocephalic and atraumatic.     Right Ear: Tympanic membrane and ear canal normal.     Left Ear: Tympanic membrane and ear canal normal.     Nose: Congestion present.     Right Turbinates: Pale.     Left Turbinates: Pale.     Right Sinus: Frontal sinus tenderness present.     Left Sinus: Frontal sinus tenderness present.     Mouth/Throat:     Mouth: Mucous membranes are moist.     Pharynx: Posterior oropharyngeal erythema present.  Eyes:     Pupils: Pupils are equal, round, and reactive to light.  Cardiovascular:     Rate and Rhythm: Regular rhythm. Tachycardia present.     Heart sounds: Normal heart sounds.     Comments: Mildly tacky at 106 in setting of fever Pulmonary:     Effort: Pulmonary effort is normal.     Breath sounds: Normal breath sounds.  Musculoskeletal:     Cervical back: Normal range of motion and neck supple.  Lymphadenopathy:     Cervical: No cervical adenopathy.  Skin:    General: Skin is warm and dry.     Findings: No rash.  Neurological:     General: No focal deficit present.     Mental Status: He is alert and oriented to person, place, and time.  Psychiatric:        Mood and Affect: Mood normal.        Behavior: Behavior normal.      UC Treatments / Results  Labs (all labs ordered are listed, but only abnormal results are displayed) Labs Reviewed  POCT INFLUENZA A/B  POCT RAPID STREP A (OFFICE)    EKG   Radiology DG Chest 2  View  Result Date: 12/06/2022  CLINICAL DATA:  Cough, fever. EXAM: CHEST - 2 VIEW COMPARISON:  Chest radiograph dated 10/04/2022. FINDINGS: The heart size and mediastinal contours are within normal limits. Both lungs are clear. The visualized skeletal structures are unremarkable. IMPRESSION: No active cardiopulmonary disease. Electronically Signed   By: Zerita Boers M.D.   On: 12/06/2022 17:20    Procedures Procedures (including critical care time)  Medications Ordered in UC Medications  acetaminophen (TYLENOL) tablet 650 mg (650 mg Oral Given 12/06/22 1643)    Initial Impression / Assessment and Plan / UC Course  I have reviewed the triage vital signs and the nursing notes.  Pertinent labs & imaging results that were available during my care of the patient were reviewed by me and considered in my medical decision making (see chart for details).     Patient given Tylenol in clinic for fever. Negative rapid flu and rapid strep.  Chest x-ray unremarkable Will cover for sinus infection given high fever and exam Augmentin twice daily for 10 days Flonase daily Tessalon as needed for cough PCP follow-up in 2 days for recheck ER precautions reviewed and patient verbalized understanding Final Clinical Impressions(s) / UC Diagnoses   Final diagnoses:  Acute cough  Sore throat  Acute frontal sinusitis, recurrence not specified     Discharge Instructions      Augmentin twice daily for 10 days Flonase daily Tessalon as needed for cough Over-the-counter Tylenol ibuprofen as needed for fever Rest and fluids Follow-up with your PCP if your symptoms do not improve Please go to emergency room for any worsening symptoms     ED Prescriptions     Medication Sig Dispense Auth. Provider   amoxicillin-clavulanate (AUGMENTIN) 875-125 MG tablet Take 1 tablet by mouth every 12 (twelve) hours for 10 days. 20 tablet Melynda Ripple, NP   benzonatate (TESSALON) 200 MG capsule Take 1 capsule  (200 mg total) by mouth 3 (three) times daily as needed for cough. 20 capsule Melynda Ripple, NP   fluticasone (FLONASE) 50 MCG/ACT nasal spray Place 1 spray into both nostrils daily. 15.8 mL Melynda Ripple, NP      PDMP not reviewed this encounter.   Melynda Ripple, NP 12/06/22 [phone]

## 2022-12-06 NOTE — ED Triage Notes (Signed)
Pt reports generalized body aches, a cough, sore throat, lightheadedness since Friday and noticed a fever of 103.9 today. Has been taking Alka Seltzer cough and cold medication since Saturday night

## 2022-12-06 NOTE — Discharge Instructions (Signed)
Augmentin twice daily for 10 days Flonase daily Tessalon as needed for cough Over-the-counter Tylenol ibuprofen as needed for fever Rest and fluids Follow-up with your PCP if your symptoms do not improve Please go to emergency room for any worsening symptoms

## 2023-01-10 ENCOUNTER — Other Ambulatory Visit: Payer: Self-pay

## 2023-01-10 ENCOUNTER — Ambulatory Visit
Admission: EM | Admit: 2023-01-10 | Discharge: 2023-01-10 | Disposition: A | Discharge status: Home / Self Care | Payer: Medicaid Other | Attending: Urgent Care | Admitting: Urgent Care

## 2023-01-10 DIAGNOSIS — U071 COVID-19: Secondary | ICD-10-CM

## 2023-01-10 MED ORDER — PAXLOVID (300/100) 20 X 150 MG & 10 X 100MG PO TBPK: ORAL_TABLET | ORAL | 0 refills | Status: DC

## 2023-01-10 MED ORDER — PSEUDOEPHEDRINE HCL 30 MG PO TABS: 30.0000 mg | ORAL_TABLET | Freq: Three times a day (TID) | ORAL | 0 refills | Status: DC | PRN

## 2023-01-10 MED ORDER — PROMETHAZINE-DM 6.25-15 MG/5ML PO SYRP: 5.0000 mL | ORAL_SOLUTION | Freq: Three times a day (TID) | ORAL | 0 refills | Status: DC | PRN

## 2023-01-10 MED ORDER — CETIRIZINE HCL 10 MG PO TABS: 10.0000 mg | ORAL_TABLET | Freq: Every day | ORAL | 0 refills | Status: DC

## 2023-01-10 NOTE — ED Triage Notes (Signed)
Pt reports, positive COVID on 01/09/23, cough and shortness of breath x 3 days.

## 2023-01-10 NOTE — ED Provider Notes (Signed)
[facility]  Note:  This document was prepared using Conservation officer, historic buildings and may include unintentional dictation errors.  MRN: [id number] DOB: Jan 14, 1996  Subjective:   Ryan [name] is a 27 y.o. male presenting for 3-day history of persistent coughing, shortness of breath, fever, malaise, body aches.  He took a home COVID test which she shows today and was positive.  He tried to avoid an office visit due to the cost.  His employer is requiring documentation that he needs treatment for COVID-19.  Patient was treated for sinusitis at the end of March.  Patient quit smoking marijuana 2 months ago.  No history of reactive airways.  Patient is requesting COVID antivirals.  No current facility-administered medications for this encounter.  Current Outpatient Medications:    naproxen (NAPROSYN) 500 MG tablet, Take 1 tablet (500 mg total) by mouth 2 (two) times daily with a meal., Disp: 30 tablet, Rfl: 0   tiZANidine (ZANAFLEX) 4 MG tablet, Take 1 tablet (4 mg total) by mouth at bedtime., Disp: 30 tablet, Rfl: 0   No Known Allergies  Past Medical History:  Diagnosis Date   Anxiety    Asthma    Depression      Past Surgical History:  Procedure Laterality Date   CIRCUMCISION  1999    Family History  Problem Relation Age of Onset   Heart murmur Mother    Clotting disorder Father    Other Maternal Grandmother        Died at 30 due to blood clots   Diabetes Maternal Grandmother    Clotting disorder Paternal Grandfather        blood clot   Heart failure Paternal Grandfather    Heart attack Paternal Aunt     Social History   Tobacco Use   Smoking status: Never    Passive exposure: Past   Smokeless tobacco: Never   Tobacco comments:    Marijuana  Vaping Use   Vaping Use: Never used  Substance Use Topics   Alcohol use: Yes    Comment: daily   Drug use: Not Currently    Types: Marijuana    ROS   Objective:   Vitals: BP  126/82 (BP Location: Left Arm)   Pulse 76   Temp 98.6 F (37 C) (Oral)   Resp 18   SpO2 95%   Physical Exam Constitutional:      General: He is not in acute distress.    Appearance: Normal appearance. He is well-developed. He is not ill-appearing, toxic-appearing or diaphoretic.  HENT:     Head: Normocephalic and atraumatic.     Right Ear: External ear normal.     Left Ear: External ear normal.     Nose: Nose normal.     Mouth/Throat:     Mouth: Mucous membranes are moist.  Eyes:     General: No scleral icterus.       Right eye: No discharge.        Left eye: No discharge.     Extraocular Movements: Extraocular movements intact.  Cardiovascular:     Rate and Rhythm: Normal rate and regular rhythm.     Heart sounds: Normal heart sounds. No murmur heard.    No friction rub. No gallop.  Pulmonary:     Effort: Pulmonary effort is normal. No respiratory distress.     Breath sounds: Normal breath sounds. No stridor. No wheezing, rhonchi or rales.  Neurological:     Mental  Status: He is alert and oriented to person, place, and time.  Psychiatric:        Mood and Affect: Mood normal.        Behavior: Behavior normal.        Thought Content: Thought content normal.     Assessment and Plan :   PDMP not reviewed this encounter.  1. COVID-19 virus infection    Due to cost burden, patient would like to avoid a repeat COVID test.  I am in agreement.  Deferred imaging given clear cardiopulmonary exam, hemodynamically stable vital signs.  Patient requested Paxlovid given his history of asthma, anxiety and depression, recent history of smoking is reasonable.  Use supportive care otherwise.  Counseled patient on potential for adverse effects with medications prescribed/recommended today, ER and return-to-clinic precautions discussed, patient verbalized understanding.    Wallis Bamberg, [place] 01/10/23 [phone]

## 2023-01-24 ENCOUNTER — Encounter (HOSPITAL_BASED_OUTPATIENT_CLINIC_OR_DEPARTMENT_OTHER): Payer: Self-pay | Admitting: Emergency Medicine

## 2023-01-24 ENCOUNTER — Emergency Department (HOSPITAL_BASED_OUTPATIENT_CLINIC_OR_DEPARTMENT_OTHER)
Admission: EM | Admit: 2023-01-24 | Discharge: 2023-01-24 | Disposition: A | Discharge status: Home / Self Care | Payer: Medicaid Other | Attending: Emergency Medicine | Admitting: Emergency Medicine

## 2023-01-24 ENCOUNTER — Other Ambulatory Visit: Payer: Self-pay

## 2023-01-24 ENCOUNTER — Emergency Department (HOSPITAL_BASED_OUTPATIENT_CLINIC_OR_DEPARTMENT_OTHER): Payer: Medicaid Other

## 2023-01-24 ENCOUNTER — Emergency Department (HOSPITAL_BASED_OUTPATIENT_CLINIC_OR_DEPARTMENT_OTHER): Payer: Medicaid Other | Admitting: Radiology

## 2023-01-24 DIAGNOSIS — M25552 Pain in left hip: Secondary | ICD-10-CM | POA: Diagnosis not present

## 2023-01-24 DIAGNOSIS — Y92410 Unspecified street and highway as the place of occurrence of the external cause: Secondary | ICD-10-CM | POA: Insufficient documentation

## 2023-01-24 DIAGNOSIS — R109 Unspecified abdominal pain: Secondary | ICD-10-CM | POA: Diagnosis not present

## 2023-01-24 DIAGNOSIS — M25512 Pain in left shoulder: Secondary | ICD-10-CM | POA: Insufficient documentation

## 2023-01-24 DIAGNOSIS — R0789 Other chest pain: Secondary | ICD-10-CM | POA: Diagnosis not present

## 2023-01-24 LAB — COMPREHENSIVE METABOLIC PANEL
ALT: 8 U/L (ref 0–44)
AST: 13 U/L — ABNORMAL LOW (ref 15–41)
Albumin: 4.2 g/dL (ref 3.5–5.0)
Alkaline Phosphatase: 47 U/L (ref 38–126)
Anion gap: 7 (ref 5–15)
BUN: 11 mg/dL (ref 6–20)
CO2: 28 mmol/L (ref 22–32)
Calcium: 9 mg/dL (ref 8.9–10.3)
Chloride: 105 mmol/L (ref 98–111)
Creatinine, Ser: 0.81 mg/dL (ref 0.61–1.24)
GFR, Estimated: >60 mL/min (ref >60)
Glucose, Bld: 112 mg/dL — ABNORMAL HIGH (ref 70–99)
Potassium: 4.1 mmol/L (ref 3.5–5.1)
Sodium: 140 mmol/L (ref 135–145)
Total Bilirubin: 0.7 mg/dL (ref 0.3–1.2)
Total Protein: 6 g/dL — ABNORMAL LOW (ref 6.5–8.1)

## 2023-01-24 LAB — CBC
HCT: 40.1 % (ref 39.0–52.0)
Hemoglobin: 13.7 g/dL (ref 13.0–17.0)
MCH: 31.4 pg (ref 26.0–34.0)
MCHC: 34.2 g/dL (ref 30.0–36.0)
MCV: 91.8 fL (ref 80.0–100.0)
Platelets: 213 10*3/uL (ref 150–400)
RBC: 4.37 MIL/uL (ref 4.22–5.81)
RDW: 12.9 % (ref 11.5–15.5)
WBC: 7.9 10*3/uL (ref 4.0–10.5)
nRBC: 0 % (ref 0.0–0.2)

## 2023-01-24 MED ORDER — OXYCODONE-ACETAMINOPHEN 5-325 MG PO TABS
1.0000 | ORAL_TABLET | Freq: Once | ORAL | Status: AC
  Administered 2023-01-24: 1 via ORAL
  Filled 2023-01-24: qty 1

## 2023-01-24 MED ORDER — METHOCARBAMOL 500 MG PO TABS: 500.0000 mg | ORAL_TABLET | Freq: Two times a day (BID) | ORAL | 0 refills | Status: DC

## 2023-01-24 MED ORDER — IOHEXOL 300 MG/ML  SOLN
100.0000 mL | Freq: Once | INTRAMUSCULAR | Status: AC | PRN
  Administered 2023-01-24: 70 mL via INTRAVENOUS

## 2023-01-24 NOTE — ED Notes (Signed)
Patient transported to X-ray 

## 2023-01-24 NOTE — ED Notes (Signed)
Patient transported to CT 

## 2023-01-24 NOTE — ED Notes (Signed)
Pt verbalized understanding of discharge instructions. Opportunity for questions provided.  
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
## 2023-01-24 NOTE — ED Notes (Signed)
Pt noted to walk into department and walk to Xray w/ no assistance and minimal difficulty.

## 2023-02-12 ENCOUNTER — Ambulatory Visit
Admission: EM | Admit: 2023-02-12 | Discharge: 2023-02-12 | Disposition: A | Discharge status: Home / Self Care | Payer: Medicaid Other | Attending: Internal Medicine | Admitting: Internal Medicine

## 2023-02-12 DIAGNOSIS — J4530 Mild persistent asthma, uncomplicated: Secondary | ICD-10-CM

## 2023-02-12 DIAGNOSIS — R509 Fever, unspecified: Secondary | ICD-10-CM

## 2023-02-12 DIAGNOSIS — J029 Acute pharyngitis, unspecified: Secondary | ICD-10-CM

## 2023-02-12 LAB — POCT INFLUENZA A/B
Influenza A, POC: NEGATIVE
Influenza B, POC: NEGATIVE

## 2023-02-12 MED ORDER — ACETAMINOPHEN 325 MG PO TABS
650.0000 mg | ORAL_TABLET | Freq: Once | ORAL | Status: AC
  Administered 2023-02-12: 650 mg via ORAL

## 2023-02-12 MED ORDER — ALBUTEROL SULFATE HFA 108 (90 BASE) MCG/ACT IN AERS: 1.0000 | INHALATION_SPRAY | Freq: Four times a day (QID) | RESPIRATORY_TRACT | 0 refills | Status: DC | PRN

## 2023-02-12 MED ORDER — PREDNISONE 20 MG PO TABS: ORAL_TABLET | ORAL | 0 refills | Status: DC

## 2023-02-12 MED ORDER — AMOXICILLIN-POT CLAVULANATE 875-125 MG PO TABS
1.0000 | ORAL_TABLET | Freq: Two times a day (BID) | ORAL | 0 refills | Status: DC
Start: 2023-02-12 — End: 2023-05-26

## 2023-02-12 NOTE — ED Triage Notes (Signed)
Pt c/o cough, body aches, fever, head congestion, runny nose x 3 days-NAD-steady gait

## 2023-02-12 NOTE — ED Provider Notes (Signed)
[facility]  Note:  This document was prepared using Conservation officer, historic buildings and may include unintentional dictation errors.  MRN: [id number] DOB: 02-01-1996  Subjective:   Ryan [name] is a 27 y.o. male presenting for 3-day history of acute onset painful swallowing, coughing, body pains, fever, congestion in his sinuses.  He is also developed chest pain, shortness of breath and wheezing.  Patient is smoking marijuana multiple times a week.  Needs a refill of his inhaler.  He did have COVID last month.  No current facility-administered medications for this encounter.  Current Outpatient Medications:    cetirizine (ZYRTEC ALLERGY) 10 MG tablet, Take 1 tablet (10 mg total) by mouth daily., Disp: 30 tablet, Rfl: 0   methocarbamol (ROBAXIN) 500 MG tablet, Take 1 tablet (500 mg total) by mouth 2 (two) times daily., Disp: 20 tablet, Rfl: 0   nirmatrelvir & ritonavir (PAXLOVID, 300/100,) 20 x 150 MG & 10 x 100MG  TBPK, Take 2 tablets nirmtrelvir and 1 tablet ritonavir twice daily., Disp: 30 tablet, Rfl: 0   promethazine-dextromethorphan (PROMETHAZINE-DM) 6.25-15 MG/5ML syrup, Take 5 mLs by mouth 3 (three) times daily as needed for cough., Disp: 200 mL, Rfl: 0   pseudoephedrine (SUDAFED) 30 MG tablet, Take 1 tablet (30 mg total) by mouth every 8 (eight) hours as needed for congestion., Disp: 30 tablet, Rfl: 0   No Known Allergies  Past Medical History:  Diagnosis Date   Anxiety    Asthma    Depression      Past Surgical History:  Procedure Laterality Date   CIRCUMCISION  1999    Family History  Problem Relation Age of Onset   Heart murmur Mother    Clotting disorder Father    Other Maternal Grandmother        Died at 73 due to blood clots   Diabetes Maternal Grandmother    Clotting disorder Paternal Grandfather        blood clot   Heart failure Paternal Grandfather    Heart attack Paternal Aunt     Social History   Tobacco Use    Smoking status: Never    Passive exposure: Past   Smokeless tobacco: Never   Tobacco comments:    Marijuana  Vaping Use   Vaping Use: Never used  Substance Use Topics   Alcohol use: Yes    Comment: daily   Drug use: Yes    Types: Marijuana    ROS   Objective:   Vitals: BP 112/71 (BP Location: Right Arm)   Pulse 98   Temp (!) 102.1 F (38.9 C) (Oral)   Resp 20   SpO2 96%   Physical Exam Constitutional:      General: He is not in acute distress.    Appearance: Normal appearance. He is well-developed and normal weight. He is not ill-appearing, toxic-appearing or diaphoretic.  HENT:     Head: Normocephalic and atraumatic.     Right Ear: Tympanic membrane, ear canal and external ear normal. No drainage, swelling or tenderness. No middle ear effusion. There is no impacted cerumen. Tympanic membrane is not erythematous or bulging.     Left Ear: Tympanic membrane, ear canal and external ear normal. No drainage, swelling or tenderness.  No middle ear effusion. There is no impacted cerumen. Tympanic membrane is not erythematous or bulging.     Nose: Nose normal. No congestion or rhinorrhea.     Mouth/Throat:     Mouth: Mucous membranes are moist.  Pharynx: Pharyngeal swelling, oropharyngeal exudate and posterior oropharyngeal erythema present. No uvula swelling.     Tonsils: Tonsillar exudate present. No tonsillar abscesses. 2+ on the right. 0 on the left.  Eyes:     General: No scleral icterus.       Right eye: No discharge.        Left eye: No discharge.     Extraocular Movements: Extraocular movements intact.     Conjunctiva/sclera: Conjunctivae normal.  Cardiovascular:     Rate and Rhythm: Normal rate and regular rhythm.     Heart sounds: Normal heart sounds. No murmur heard.    No friction rub. No gallop.  Pulmonary:     Effort: Pulmonary effort is normal. No respiratory distress.     Breath sounds: Normal breath sounds. No stridor. No wheezing, rhonchi or rales.   Musculoskeletal:     Cervical back: Normal range of motion and neck supple. No rigidity. No muscular tenderness.  Neurological:     General: No focal deficit present.     Mental Status: He is alert and oriented to person, place, and time.  Psychiatric:        Mood and Affect: Mood normal.        Behavior: Behavior normal.        Thought Content: Thought content normal.        Judgment: Judgment normal.    Results for orders placed or performed during the hospital encounter of 02/12/23 (from the past 24 hour(s))  POCT Influenza A/B     Status: None   Collection Time: 02/12/23  7:51 PM  Result Value Ref Range   Influenza A, POC Negative Negative   Influenza B, POC Negative Negative   Assessment and Plan :   PDMP not reviewed this encounter.  1. Acute pharyngitis, unspecified etiology   2. Fever, unspecified   3. Mild persistent asthma without complication    Recommended an oral prednisone course for his respiratory symptoms in the context of his asthma. Deferred imaging given clear cardiopulmonary exam, hemodynamically stable vital signs.  Refilled his albuterol inhaler.  Otherwise, Will treat for acute pharyngitis, acute tonsillitis given his physical exam findings, high fever and overall symptoms.  Patient is to start Augmentin, use supportive care otherwise. Counseled patient on potential for adverse effects with medications prescribed/recommended today, ER and return-to-clinic precautions discussed, patient verbalized understanding.    Wallis Bamberg, [place] 02/12/23 1955

## 2023-05-25 ENCOUNTER — Other Ambulatory Visit: Payer: Self-pay

## 2023-05-25 ENCOUNTER — Emergency Department (HOSPITAL_COMMUNITY)
Admission: EM | Admit: 2023-05-25 | Discharge: 2023-05-26 | Disposition: A | Discharge status: Other Acute Inpatient Hospital | Payer: Medicaid Other | Source: Home / Self Care | Attending: Emergency Medicine | Admitting: Emergency Medicine

## 2023-05-25 ENCOUNTER — Encounter (HOSPITAL_COMMUNITY): Payer: Self-pay

## 2023-05-25 DIAGNOSIS — F29 Unspecified psychosis not due to a substance or known physiological condition: Secondary | ICD-10-CM | POA: Insufficient documentation

## 2023-05-25 DIAGNOSIS — T1491XA Suicide attempt, initial encounter: Secondary | ICD-10-CM | POA: Diagnosis present

## 2023-05-25 DIAGNOSIS — X838XXA Intentional self-harm by other specified means, initial encounter: Secondary | ICD-10-CM | POA: Insufficient documentation

## 2023-05-25 DIAGNOSIS — Y906 Blood alcohol level of 120-199 mg/100 ml: Secondary | ICD-10-CM | POA: Insufficient documentation

## 2023-05-25 DIAGNOSIS — T450X2A Poisoning by antiallergic and antiemetic drugs, intentional self-harm, initial encounter: Secondary | ICD-10-CM | POA: Insufficient documentation

## 2023-05-25 DIAGNOSIS — F32A Depression, unspecified: Secondary | ICD-10-CM | POA: Insufficient documentation

## 2023-05-25 DIAGNOSIS — R45851 Suicidal ideations: Secondary | ICD-10-CM

## 2023-05-25 LAB — CBC WITH DIFFERENTIAL/PLATELET
Abs Immature Granulocytes: 0.01 10*3/uL (ref 0.00–0.07)
Basophils Absolute: 0 10*3/uL (ref 0.0–0.1)
Basophils Relative: 1 %
Eosinophils Absolute: 0.1 10*3/uL (ref 0.0–0.5)
Eosinophils Relative: 1 %
HCT: 45.6 % (ref 39.0–52.0)
Hemoglobin: 16 g/dL (ref 13.0–17.0)
Immature Granulocytes: 0 %
Lymphocytes Relative: 71 %
Lymphs Abs: 4 10*3/uL (ref 0.7–4.0)
MCH: 31.7 pg (ref 26.0–34.0)
MCHC: 35.1 g/dL (ref 30.0–36.0)
MCV: 90.3 fL (ref 80.0–100.0)
Monocytes Absolute: 0.4 10*3/uL (ref 0.1–1.0)
Monocytes Relative: 6 %
Neutro Abs: 1.2 10*3/uL — ABNORMAL LOW (ref 1.7–7.7)
Neutrophils Relative %: 21 %
Platelets: 242 10*3/uL (ref 150–400)
RBC: 5.05 MIL/uL (ref 4.22–5.81)
RDW: 13.1 % (ref 11.5–15.5)
WBC: 5.7 10*3/uL (ref 4.0–10.5)
nRBC: 0 % (ref 0.0–0.2)

## 2023-05-25 LAB — COMPREHENSIVE METABOLIC PANEL
ALT: 16 U/L (ref 0–44)
AST: 19 U/L (ref 15–41)
Albumin: 4.9 g/dL (ref 3.5–5.0)
Alkaline Phosphatase: 63 U/L (ref 38–126)
Anion gap: 13 (ref 5–15)
BUN: 7 mg/dL (ref 6–20)
CO2: 22 mmol/L (ref 22–32)
Calcium: 9.1 mg/dL (ref 8.9–10.3)
Chloride: 105 mmol/L (ref 98–111)
Creatinine, Ser: 0.69 mg/dL (ref 0.61–1.24)
GFR, Estimated: >60 mL/min (ref >60)
Glucose, Bld: 85 mg/dL (ref 70–99)
Potassium: 3.6 mmol/L (ref 3.5–5.1)
Sodium: 140 mmol/L (ref 135–145)
Total Bilirubin: 0.9 mg/dL (ref 0.3–1.2)
Total Protein: 8 g/dL (ref 6.5–8.1)

## 2023-05-25 LAB — RAPID URINE DRUG SCREEN, HOSP PERFORMED
Amphetamines: NOT DETECTED
Barbiturates: NOT DETECTED
Benzodiazepines: NOT DETECTED
Cocaine: NOT DETECTED
Opiates: NOT DETECTED
Tetrahydrocannabinol: POSITIVE — AB

## 2023-05-25 LAB — ETHANOL: Alcohol, Ethyl (B): 165 mg/dL — ABNORMAL HIGH (ref <10)

## 2023-05-25 LAB — CBG MONITORING, ED: Glucose-Capillary: 89 mg/dL (ref 70–99)

## 2023-05-25 LAB — SALICYLATE LEVEL: Salicylate Lvl: <7 mg/dL — ABNORMAL LOW (ref 7.0–30.0)

## 2023-05-25 LAB — ACETAMINOPHEN LEVEL: Acetaminophen (Tylenol), Serum: <10 ug/mL — ABNORMAL LOW (ref 10–30)

## 2023-05-25 MED ORDER — LORAZEPAM 1 MG PO TABS
1.0000 mg | ORAL_TABLET | ORAL | Status: DC | PRN
  Administered 2023-05-25 – 2023-05-26 (×2): 1 mg via ORAL
  Filled 2023-05-25 (×3): qty 1

## 2023-05-25 MED ORDER — SODIUM CHLORIDE 0.9 % IV BOLUS
1000.0000 mL | Freq: Once | INTRAVENOUS | Status: AC
  Administered 2023-05-25: 1000 mL via INTRAVENOUS
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
## 2023-05-25 NOTE — Consult Note (Incomplete)
[facility] Consult Note  Patient Name: Ryan [name] MRN: [id number] DOB: [birth year] DATE OF Consult: 05/25/2023  PRIMARY PSYCHIATRIC DIAGNOSES  1.  *** 2.  *** 3.  ***  RECOMMENDATIONS  Recommendations: Medication recommendations: *** Non-Medication/therapeutic recommendations: *** Is inpatient psychiatric hospitalization recommended for this patient? {Yes/No/why:304550013} Communication: Treatment team members (and family members if applicable) who were involved in treatment/care discussions and planning, and with whom we spoke or engaged with via secure text/chat, include the following: ***  Thank you for involving [place] in the care of this patient. If you have any additional questions or concerns, please call [phone] and ask for me or the provider on-call.  TELEPSYCHIATRY ATTESTATION & CONSENT  As the provider for this telehealth consult, I attest that I verified the patient's identity using two separate identifiers, introduced myself to the patient, provided my credentials, disclosed my location, and performed this encounter via a HIPAA-compliant, real-time, face-to-face, two-way, interactive audio and video platform and with the full consent and agreement of the patient (or guardian as applicable.)  Patient physical location: ED in [facility]. Telehealth provider physical location: home office in state of [place].  Video start time: *** (Central Time) Video end time: *** (Central Time)  IDENTIFYING DATA  Ryan [name] is a 27 y.o. year-old male for whom a psychiatric consultation has been ordered by the primary provider. The patient was identified using two separate identifiers.  CHIEF COMPLAINT/REASON FOR CONSULT  Suicide Attempt   HISTORY OF PRESENT ILLNESS (HPI)  The patient is a 27yo male who presented to the emergency department, via EMS from home, with chief complaint of suicide attempt.     PAST PSYCHIATRIC HISTORY  *** Otherwise as  per HPI above.  PAST MEDICAL HISTORY  Past Medical History:  Diagnosis Date  . Anxiety   . Asthma   . Depression      HOME MEDICATIONS  Facility Ordered Medications  Medication  . [COMPLETED] sodium chloride 0.9 % bolus 1,000 mL  . LORazepam (ATIVAN) tablet 1 mg   PTA Medications  Medication Sig  . nirmatrelvir & ritonavir (PAXLOVID, 300/100,) 20 x 150 MG & 10 x 100MG  TBPK Take 2 tablets nirmtrelvir and 1 tablet ritonavir twice daily.  . cetirizine (ZYRTEC ALLERGY) 10 MG tablet Take 1 tablet (10 mg total) by mouth daily.  . pseudoephedrine (SUDAFED) 30 MG tablet Take 1 tablet (30 mg total) by mouth every 8 (eight) hours as needed for congestion.  . promethazine-dextromethorphan (PROMETHAZINE-DM) 6.25-15 MG/5ML syrup Take 5 mLs by mouth 3 (three) times daily as needed for cough.  . methocarbamol (ROBAXIN) 500 MG tablet Take 1 tablet (500 mg total) by mouth 2 (two) times daily.  Marland Kitchen amoxicillin-clavulanate (AUGMENTIN) 875-125 MG tablet Take 1 tablet by mouth 2 (two) times daily.  . predniSONE (DELTASONE) 20 MG tablet Take 2 tablets daily with breakfast.  . albuterol (VENTOLIN HFA) 108 (90 Base) MCG/ACT inhaler Inhale 1-2 puffs into the lungs every 6 (six) hours as needed for wheezing or shortness of breath.     ALLERGIES  No Known Allergies  SOCIAL & SUBSTANCE USE HISTORY  Social History   Socioeconomic History  . Marital status: Single    Spouse name: Not on file  . Number of children: Not on file  . Years of education: Not on file  . Highest education level: Not on file  Occupational History  . Not on file  Tobacco Use  . Smoking status: Never    Passive exposure: Past  .  Smokeless tobacco: Never  . Tobacco comments:    Marijuana  Vaping Use  . Vaping status: Never Used  Substance and Sexual Activity  . Alcohol use: Yes    Comment: daily  . Drug use: Yes    Types: Marijuana  . Sexual activity: Yes    Birth control/protection: None  Other Topics Concern  . 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 Passive exposure: Past  Smokeless Tobacco Never  Tobacco Comments   Marijuana   Social History   Substance and Sexual Activity  Alcohol Use Yes   Comment: daily   Social History   Substance and Sexual Activity  Drug Use Yes  . Types: Marijuana    Additional pertinent information ***.  Substance Use: Alcohol- 165 upon arrival.  Marijuana- UDS + THC    FAMILY HISTORY  Family History  Problem Relation Age of Onset  . Heart murmur Mother   . Clotting disorder Father   . Other Maternal Grandmother        Died at 29 due to blood clots  . Diabetes Maternal Grandmother   . Clotting disorder Paternal Grandfather        blood clot  . Heart failure Paternal Grandfather   . Heart attack Paternal Aunt    Family Psychiatric History (if known):  ***  MENTAL STATUS EXAM (MSE)  Presentation  General Appearance: No data recorded Eye Contact:No data recorded Speech:No data recorded Speech Volume:No data recorded Handedness:No data recorded  Mood and Affect  Mood:No data recorded Affect:No data recorded  Thought Process  Thought Processes:No data recorded Descriptions of Associations:No data recorded Orientation:No data recorded Thought Content:No data recorded History of Schizophrenia/Schizoaffective disorder:No data recorded Duration of Psychotic Symptoms:No data recorded Hallucinations:No data recorded Ideas of Reference:No data recorded Suicidal Thoughts:No data recorded Homicidal Thoughts:No data recorded  Sensorium  Memory:No data recorded Judgment:No data recorded Insight:No data recorded  Executive Functions   Concentration:No data recorded Attention Span:No data recorded Recall:No data recorded Fund of Knowledge:No data recorded Language:No data recorded  Psychomotor Activity  Psychomotor Activity:No data recorded  Assets  Assets:No data recorded  Sleep  Sleep:No data recorded  VITALS  Blood pressure 117/79, pulse 83, temperature 97.8 F (36.6 C), resp. rate 18, height 5\' 10"  (1.778 m), weight 64.8 kg, SpO2 98%.  LABS  Admission on 05/25/2023  Component Date Value Ref Range Status  . Sodium 05/25/2023 140  135 - 145 mmol/L Final  . Potassium 05/25/2023 3.6  3.5 - 5.1 mmol/L Final  . Chloride 05/25/2023 105  98 - 111 mmol/L Final  . CO2 05/25/2023 22  22 - 32 mmol/L Final  . Glucose, Bld 05/25/2023 85  70 - 99 mg/dL Final   Glucose reference range applies only to samples taken after fasting for at least 8 hours.  . BUN 05/25/2023 7  6 - 20 mg/dL Final  . Creatinine, Ser 05/25/2023 0.69  0.61 - 1.24 mg/dL Final  . Calcium 16/06/9603 9.1  8.9 - 10.3 mg/dL Final  . Total Protein 05/25/2023 8.0  6.5 - 8.1 g/dL Final  . Albumin 54/05/8118 4.9  3.5 - 5.0 g/dL Final  .  AST 05/25/2023 19  15 - 41 U/L Final  . ALT 05/25/2023 16  0 - 44 U/L Final  . Alkaline Phosphatase 05/25/2023 63  38 - 126 U/L Final  . Total Bilirubin 05/25/2023 0.9  0.3 - 1.2 mg/dL Final  . GFR, Estimated 05/25/2023 >60  >60 mL/min Final   Comment: (NOTE) Calculated using the CKD-EPI Creatinine Equation (2021)   . Anion gap 05/25/2023 13  5 - 15 Final   Performed at [facility], [address], [place], [place] [address]  . Alcohol, Ethyl (B) 05/25/2023 165 (H)  <10 mg/dL Final   Comment: (NOTE) Lowest detectable limit for serum alcohol is 10 mg/dL.  For medical purposes only. Performed at [facility], [address], [place], [place] [address]   . Opiates 05/25/2023 NONE DETECTED  NONE DETECTED Final  . Cocaine 05/25/2023 NONE DETECTED  NONE DETECTED Final  .  Benzodiazepines 05/25/2023 NONE DETECTED  NONE DETECTED Final  . Amphetamines 05/25/2023 NONE DETECTED  NONE DETECTED Final  . Tetrahydrocannabinol 05/25/2023 POSITIVE (A)  NONE DETECTED Final  . Barbiturates 05/25/2023 NONE DETECTED  NONE DETECTED Final   Comment: (NOTE) DRUG SCREEN FOR MEDICAL PURPOSES ONLY.  IF CONFIRMATION IS NEEDED FOR ANY PURPOSE, NOTIFY LAB WITHIN 5 DAYS.  LOWEST DETECTABLE LIMITS FOR URINE DRUG SCREEN Drug Class                     Cutoff (ng/mL) Amphetamine and metabolites    1000 Barbiturate and metabolites    200 Benzodiazepine                 200 Opiates and metabolites        300 Cocaine and metabolites        300 THC                            50 Performed at [facility], [address] [place], [place] [address]   . WBC 05/25/2023 5.7  4.0 - 10.5 K/uL Final  . RBC 05/25/2023 5.05  4.22 - 5.81 MIL/uL Final  . Hemoglobin 05/25/2023 16.0  13.0 - 17.0 g/dL Final  . HCT 91/47/8295 45.6  39.0 - 52.0 % Final  . MCV 05/25/2023 90.3  80.0 - 100.0 fL Final  . MCH 05/25/2023 31.7  26.0 - 34.0 pg Final  . MCHC 05/25/2023 35.1  30.0 - 36.0 g/dL Final  . RDW 62/13/0865 13.1  11.5 - 15.5 % Final  . Platelets 05/25/2023 242  150 - 400 K/uL Final  . nRBC 05/25/2023 0.0  0.0 - 0.2 % Final  . Neutrophils Relative % 05/25/2023 21  % Final  . Neutro Abs 05/25/2023 1.2 (L)  1.7 - 7.7 K/uL Final  . Lymphocytes Relative 05/25/2023 71  % Final  . Lymphs Abs 05/25/2023 4.0  0.7 - 4.0 K/uL Final  . Monocytes Relative 05/25/2023 6  % Final  . Monocytes Absolute 05/25/2023 0.4  0.1 - 1.0 K/uL Final  . Eosinophils Relative 05/25/2023 1  % Final  . Eosinophils Absolute 05/25/2023 0.1  0.0 - 0.5 K/uL Final  . Basophils Relative 05/25/2023 1  % Final  . Basophils Absolute 05/25/2023 0.0  0.0 - 0.1 K/uL Final  . Immature Granulocytes 05/25/2023 0  % Final  . Abs Immature Granulocytes 05/25/2023 0.01  0.00 - 0.07 K/uL Final  . Reactive, Benign Lymphocytes  05/25/2023 PRESENT   Final  Performed at [facility], [address], [place], [place] [address]  . Glucose-Capillary 05/25/2023 89  70 - 99 mg/dL Final   Glucose reference range applies only to samples taken after fasting for at least 8 hours.  . Salicylate Lvl 05/25/2023 <7.0 (L)  7.0 - 30.0 mg/dL Final   Performed at [facility], [address], [place], [place] [address]  . Acetaminophen (Tylenol), Serum 05/25/2023 <10 (L)  10 - 30 ug/mL Final   Comment: (NOTE) Therapeutic concentrations vary significantly. A range of 10-30 ug/mL  may be an effective concentration for many patients. However, some  are best treated at concentrations outside of this range. Acetaminophen concentrations >150 ug/mL at 4 hours after ingestion  and >50 ug/mL at 12 hours after ingestion are often associated with  toxic reactions.  Performed at [facility], [address], [place], [place] [address]     PSYCHIATRIC REVIEW OF SYSTEMS (ROS)  ROS: Notable for the following relevant positive findings: ROS  Additional findings:      Musculoskeletal: {Musculoskeletal neeeds/assessment:304550014}      Gait & Station: {Gait and Station:304550016}      Pain Screening: {Pain Description:304550015}      Nutrition & Dental Concerns: {Nutrition & Dental Concerns:304550017}  RISK FORMULATION/ASSESSMENT  Is the patient experiencing any suicidal or homicidal ideations: {yes/no:20286}       Explain if yes: *** Protective factors considered for safety management: ***  Risk factors/concerns considered for safety management: *** {CHL BH Risk Factors Safety Management:304550011}  Is there a safety management plan with the patient and treatment team to minimize risk factors and promote protective factors: {yes/no:20286}           Explain: *** Is crisis care placement or psychiatric hospitalization recommended: {yes/no:20286}     Based on my current evaluation  and risk assessment, patient is determined at this time to be at:  {Risk level:304550009}  *RISK ASSESSMENT Risk assessment is a dynamic process; it is possible that this patient's condition, and risk level, may change. This should be re-evaluated and managed over time as appropriate. Please re-consult psychiatric consult services if additional assistance is needed in terms of risk assessment and management. If your team decides to discharge this patient, please advise the patient how to best access emergency psychiatric services, or to call 911, if their condition worsens or they feel unsafe in any way.   Assunta Gambles, NP Telepsychiatry Consult Services

## 2023-05-25 NOTE — ED Provider Notes (Signed)
[place] EMERGENCY DEPARTMENT AT [facility] Provider Note   CSN: [id number] Arrival date & time: 05/25/23  1755     History  Chief Complaint  Patient presents with   Drug Overdose    Ryan [name] is a 27 y.o. male.  27 y.o male with a PMH of depression presents to the ED with a chief complaint of suicide attempt. Patient reports taking 7 unisom 50 mg, 5 beers and smoked some marijuana. He told this to his family who called EMS while in [place] and they brought him here.  Patient states that he has tried to obtain help for this, was on medication back in high school for approximately 1 month however the medication did not work.  Then his insurance lapsed, he was also doing therapy but stopped doing this due to financial strain.  He is here endorsing severe depression along with suicidal ideations.  He denies any homicidal ideations, no visual or auditory hallucinations.  The history is provided by the patient.  Drug Overdose Pertinent negatives include no chest pain, no abdominal pain and no shortness of breath.       Home Medications Prior to Admission medications   Medication Sig Start Date End Date Taking? Authorizing Provider  albuterol (VENTOLIN HFA) 108 (90 Base) MCG/ACT inhaler Inhale 1-2 puffs into the lungs every 6 (six) hours as needed for wheezing or shortness of breath. 02/12/23   Wallis Bamberg, PA-C  amoxicillin-clavulanate (AUGMENTIN) 875-125 MG tablet Take 1 tablet by mouth 2 (two) times daily. 02/12/23   Wallis Bamberg, PA-C  cetirizine (ZYRTEC ALLERGY) 10 MG tablet Take 1 tablet (10 mg total) by mouth daily. 01/10/23   Wallis Bamberg, PA-C  methocarbamol (ROBAXIN) 500 MG tablet Take 1 tablet (500 mg total) by mouth 2 (two) times daily. 01/24/23   Smoot, Shawn Route, PA-C  nirmatrelvir & ritonavir (PAXLOVID, 300/100,) 20 x 150 MG & 10 x 100MG  TBPK Take 2 tablets nirmtrelvir and 1 tablet ritonavir twice daily. 01/10/23   Wallis Bamberg, PA-C  predniSONE (DELTASONE) 20 MG  tablet Take 2 tablets daily with breakfast. 02/12/23   Wallis Bamberg, PA-C  promethazine-dextromethorphan (PROMETHAZINE-DM) 6.25-15 MG/5ML syrup Take 5 mLs by mouth 3 (three) times daily as needed for cough. 01/10/23   Wallis Bamberg, PA-C  pseudoephedrine (SUDAFED) 30 MG tablet Take 1 tablet (30 mg total) by mouth every 8 (eight) hours as needed for congestion. 01/10/23   Wallis Bamberg, PA-C      Allergies    Patient has no known allergies.    Review of Systems   Review of Systems  Constitutional:  Negative for fever.  Respiratory:  Negative for shortness of breath.   Cardiovascular:  Negative for chest pain.  Gastrointestinal:  Negative for abdominal pain.  Genitourinary:  Negative for flank pain.  Musculoskeletal:  Negative for back pain.  Skin:  Negative for pallor and wound.  Psychiatric/Behavioral:  Positive for suicidal ideas.     Physical Exam Updated Vital Signs BP 117/79   Pulse 83   Temp 98.1 F (36.7 C)   Resp 18   Ht 5\' 10"  (1.778 m)   Wt 64.8 kg   SpO2 98%   BMI 20.50 kg/m  Physical Exam Vitals and nursing note reviewed.  Constitutional:      Appearance: He is well-developed.  HENT:     Head: Normocephalic and atraumatic.  Eyes:     General: No scleral icterus.    Pupils: Pupils are equal, round, and reactive to light.  Cardiovascular:     Heart sounds: Normal heart sounds.  Pulmonary:     Effort: Pulmonary effort is normal.     Breath sounds: Normal breath sounds. No wheezing.  Chest:     Chest wall: No tenderness.  Abdominal:     General: Bowel sounds are normal. There is no distension.     Palpations: Abdomen is soft.     Tenderness: There is no abdominal tenderness.  Musculoskeletal:        General: No tenderness or deformity.     Cervical back: Normal range of motion.  Skin:    General: Skin is warm and dry.  Neurological:     Mental Status: He is alert and oriented to person, place, and time.  Psychiatric:        Attention and Perception: Attention  normal.        Mood and Affect: Affect is flat.        Speech: Speech normal.        Behavior: Behavior is withdrawn.        Thought Content: Thought content includes suicidal ideation.     ED Results / Procedures / Treatments   Labs (all labs ordered are listed, but only abnormal results are displayed) Labs Reviewed  ETHANOL - Abnormal; Notable for the following components:      Result Value   Alcohol, Ethyl (B) 165 (*)    All other components within normal limits  RAPID URINE DRUG SCREEN, HOSP PERFORMED - Abnormal; Notable for the following components:   Tetrahydrocannabinol POSITIVE (*)    All other components within normal limits  CBC WITH DIFFERENTIAL/PLATELET - Abnormal; Notable for the following components:   Neutro Abs 1.2 (*)    All other components within normal limits  SALICYLATE LEVEL - Abnormal; Notable for the following components:   Salicylate Lvl <7.0 (*)    All other components within normal limits  ACETAMINOPHEN LEVEL - Abnormal; Notable for the following components:   Acetaminophen (Tylenol), Serum <10 (*)    All other components within normal limits  COMPREHENSIVE METABOLIC PANEL  CBG MONITORING, ED    EKG EKG Interpretation Date/Time:  Friday May 25 2023 18:41:13 EDT Ventricular Rate:  70 PR Interval:  148 QRS Duration:  113 QT Interval:  367 QTC Calculation: 396 R Axis:   90  Text Interpretation: Sinus rhythm Incomplete right bundle branch block No significant change since last tracing Confirmed by Richardean Canal [phone]) on 05/25/2023 7:00:39 PM  Radiology No results found.  Procedures Procedures    Medications Ordered in ED Medications  LORazepam (ATIVAN) tablet 1 mg (has no administration in time range)  sodium chloride 0.9 % bolus 1,000 mL (0 mLs Intravenous Stopped 05/25/23 2150)    ED Course/ Medical Decision Making/ A&P Clinical Course as of 05/25/23 2302  Fri May 25, 2023  2041 Tetrahydrocannabinol(!): POSITIVE [JS]  2052  Alcohol, Ethyl (B)(!): 165 [JS]    Clinical Course User Index [JS] Claude Manges, PA-C                                 Medical Decision Making Amount and/or Complexity of Data Reviewed Labs: ordered. Decision-making details documented in ED Course.  Risk Prescription drug management.   Patient presents to the ED with a chief complaint of suicidal ideations via EMS.  Patient told his mother along with his brother in [place] that he had taken 7 Unisom pills  of mainly Benadryl, 5 beers, had some marijuana as well.  He was trying to take his life as he is severely depressed but does not want to talk about was causing him to be so upset.  6: 22 PM Call was placed to poison control in order to obtain further recommendations as patient did have 7 unit some pills of diphenhydramine, they recommended to look out for anticholinergic effects which is urinary retention, he has urinated while in the ED.  EKG to be noted for QTc prolongation versus QRS prolongation.  To be observed for approximately 4 hours although we do not know when this episode took place this patient does not want to elaborate on this.  EKG is normal sinus rhythm 70.  Interpretation of his blood will be a CBC with no leukocytosis, hemoglobin stable.  CMP without any electrolyte derangement.  His alcohol level is elevated at 165, his UDS was positive here for [facility], he has been evaluated and monitored for approximately 3 hours and 48 minutes, I do feel that he is medically clear at this time in order to proceed with TTS consultation.   Portions of this note were generated with Scientist, clinical (histocompatibility and immunogenetics). Dictation errors may occur despite best attempts at proofreading.   Final Clinical Impression(s) / ED Diagnoses Final diagnoses:  Suicidal ideation  Suicide attempt [facility])    Rx / DC Orders ED Discharge Orders     None         Claude Manges, PA-C 05/25/23 2302    Charlynne Pander, MD 05/26/23 1446

## 2023-05-25 NOTE — BH Assessment (Signed)
  This consult has been deferred to [facility]. Dr. Alto Denver will see pt at 12:15 am.

## 2023-05-26 ENCOUNTER — Encounter (HOSPITAL_COMMUNITY): Payer: Self-pay | Admitting: Nurse Practitioner

## 2023-05-26 ENCOUNTER — Inpatient Hospital Stay (HOSPITAL_COMMUNITY)
Admission: EM | Admit: 2023-05-26 | Discharge: 2023-05-28 | DRG: 882 | Disposition: A | Discharge status: Home / Self Care | Payer: Medicaid Other | Source: Intra-hospital | Attending: Psychiatry | Admitting: Psychiatry

## 2023-05-26 DIAGNOSIS — Z5986 Financial insecurity: Secondary | ICD-10-CM

## 2023-05-26 DIAGNOSIS — T50902A Poisoning by unspecified drugs, medicaments and biological substances, intentional self-harm, initial encounter: Principal | ICD-10-CM | POA: Diagnosis present

## 2023-05-26 DIAGNOSIS — F1210 Cannabis abuse, uncomplicated: Secondary | ICD-10-CM | POA: Diagnosis present

## 2023-05-26 DIAGNOSIS — Z9151 Personal history of suicidal behavior: Secondary | ICD-10-CM

## 2023-05-26 DIAGNOSIS — J45909 Unspecified asthma, uncomplicated: Secondary | ICD-10-CM | POA: Diagnosis present

## 2023-05-26 DIAGNOSIS — F332 Major depressive disorder, recurrent severe without psychotic features: Secondary | ICD-10-CM | POA: Diagnosis present

## 2023-05-26 DIAGNOSIS — T1491XA Suicide attempt, initial encounter: Secondary | ICD-10-CM | POA: Diagnosis present

## 2023-05-26 DIAGNOSIS — G4700 Insomnia, unspecified: Secondary | ICD-10-CM | POA: Diagnosis present

## 2023-05-26 DIAGNOSIS — R030 Elevated blood-pressure reading, without diagnosis of hypertension: Secondary | ICD-10-CM | POA: Diagnosis present

## 2023-05-26 DIAGNOSIS — F419 Anxiety disorder, unspecified: Secondary | ICD-10-CM | POA: Diagnosis present

## 2023-05-26 DIAGNOSIS — F4323 Adjustment disorder with mixed anxiety and depressed mood: Secondary | ICD-10-CM | POA: Diagnosis present

## 2023-05-26 DIAGNOSIS — Z91148 Patient's other noncompliance with medication regimen for other reason: Secondary | ICD-10-CM

## 2023-05-26 DIAGNOSIS — Z62810 Personal history of physical and sexual abuse in childhood: Secondary | ICD-10-CM | POA: Diagnosis not present

## 2023-05-26 MED ORDER — LORAZEPAM 2 MG/ML IJ SOLN: 2.0000 mg | Freq: Three times a day (TID) | INTRAMUSCULAR | Status: DC | PRN

## 2023-05-26 MED ORDER — LORAZEPAM 1 MG PO TABS: 2.0000 mg | ORAL_TABLET | Freq: Three times a day (TID) | ORAL | Status: DC | PRN

## 2023-05-26 MED ORDER — HALOPERIDOL LACTATE 5 MG/ML IJ SOLN: 5.0000 mg | Freq: Three times a day (TID) | INTRAMUSCULAR | Status: DC | PRN

## 2023-05-26 MED ORDER — LORAZEPAM 1 MG PO TABS
1.0000 mg | ORAL_TABLET | Freq: Three times a day (TID) | ORAL | Status: DC | PRN
  Administered 2023-05-26: 1 mg via ORAL
  Filled 2023-05-26: qty 1

## 2023-05-26 MED ORDER — DIPHENHYDRAMINE HCL 25 MG PO CAPS: 50.0000 mg | ORAL_CAPSULE | Freq: Three times a day (TID) | ORAL | Status: DC | PRN

## 2023-05-26 MED ORDER — LORAZEPAM 1 MG PO TABS
1.0000 mg | ORAL_TABLET | Freq: Three times a day (TID) | ORAL | Status: DC | PRN
  Administered 2023-05-26: 1 mg via ORAL

## 2023-05-26 MED ORDER — ESCITALOPRAM OXALATE 5 MG PO TABS
5.0000 mg | ORAL_TABLET | Freq: Every day | ORAL | Status: DC
  Administered 2023-05-27: 5 mg via ORAL
  Filled 2023-05-26 (×3): qty 1

## 2023-05-26 MED ORDER — ENSURE ENLIVE PO LIQD
237.0000 mL | Freq: Two times a day (BID) | ORAL | Status: DC
  Filled 2023-05-26 (×5): qty 237

## 2023-05-26 MED ORDER — TRAZODONE HCL 50 MG PO TABS
50.0000 mg | ORAL_TABLET | Freq: Every evening | ORAL | Status: DC | PRN
  Administered 2023-05-26 – 2023-05-27 (×2): 50 mg via ORAL
  Filled 2023-05-26 (×2): qty 1

## 2023-05-26 MED ORDER — HYDROXYZINE HCL 25 MG PO TABS
25.0000 mg | ORAL_TABLET | Freq: Three times a day (TID) | ORAL | Status: DC | PRN
  Administered 2023-05-27 – 2023-05-28 (×3): 25 mg via ORAL
  Filled 2023-05-26 (×4): qty 1

## 2023-05-26 MED ORDER — DIPHENHYDRAMINE HCL 50 MG/ML IJ SOLN: 50.0000 mg | Freq: Three times a day (TID) | INTRAMUSCULAR | Status: DC | PRN

## 2023-05-26 MED ORDER — HALOPERIDOL 5 MG PO TABS: 5.0000 mg | ORAL_TABLET | Freq: Three times a day (TID) | ORAL | Status: DC | PRN

## 2023-05-26 MED ORDER — ALUM & MAG HYDROXIDE-SIMETH 200-200-20 MG/5ML PO SUSP: 30.0000 mL | ORAL | Status: DC | PRN

## 2023-05-26 MED ORDER — ACETAMINOPHEN 325 MG PO TABS: 650.0000 mg | ORAL_TABLET | Freq: Four times a day (QID) | ORAL | Status: DC | PRN

## 2023-05-26 MED ORDER — INFLUENZA VIRUS VACC SPLIT PF (FLUZONE) 0.5 ML IM SUSY
0.5000 mL | PREFILLED_SYRINGE | INTRAMUSCULAR | Status: DC
  Filled 2023-05-26: qty 0.5

## 2023-05-26 MED ORDER — ESCITALOPRAM OXALATE 10 MG PO TABS
5.0000 mg | ORAL_TABLET | Freq: Every day | ORAL | Status: DC
  Administered 2023-05-26: 5 mg via ORAL
  Filled 2023-05-26: qty 1

## 2023-05-26 NOTE — Progress Notes (Signed)
   05/26/23 2248  Psych Admission Type (Psych Patients Only)  Admission Status Voluntary/72 hour document signed  Psychosocial Assessment  Patient Complaints Insomnia;Anxiety  Eye Contact Fair  Facial Expression Animated;Anxious  Affect Appropriate to circumstance  Speech Logical/coherent  Interaction Assertive  Motor Activity Rigidity;Pacing;Restless  Appearance/Hygiene In scrubs  Behavior Characteristics Appropriate to situation  Mood Anxious  Thought Process  Coherency Circumstantial  Content Blaming others  Delusions None reported or observed  Perception WDL  Hallucination None reported or observed  Judgment Impaired  Confusion None  Danger to Self  Current suicidal ideation? Denies  Self-Injurious Behavior No self-injurious ideation or behavior indicators observed or expressed   Agreement Not to Harm Self Yes  Description of Agreement verbal  Danger to Others  Danger to Others None reported or observed

## 2023-05-26 NOTE — BH Assessment (Signed)
Dr. Alto Denver attempted to assess pt but no one responded to Cart 1. Pt will be scheduled at a later time.

## 2023-05-26 NOTE — Plan of Care (Signed)
  Problem: Education: Goal: Knowledge of [facility] Education information/materials will improve Outcome: Progressing Goal: Emotional status will improve Outcome: Progressing Goal: Mental status will improve Outcome: Progressing Goal: Verbalization of understanding the information provided will improve Outcome: Progressing   
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
## 2023-05-26 NOTE — Tx Team (Signed)
Initial Treatment Plan 05/26/2023 6:55 PM Danise Mina ZOX:[id number]    PATIENT STRESSORS: Marital or family conflict   Job and parenting stress.   PATIENT STRENGTHS: Ability for insight  Active sense of humor  Average or above average intelligence  Communication skills  General fund of knowledge  Motivation for treatment/growth  Physical Health  Special hobby/interest  Supportive family/friends  Work skills    PATIENT IDENTIFIED PROBLEMS: Suicide Risk  Insomnia  Coping skills for anxiety  Healthy communication skills               DISCHARGE CRITERIA:  Improved stabilization in mood, thinking, and/or behavior Need for constant or close observation no longer present Reduction of life-threatening or endangering symptoms to within safe limits  PRELIMINARY DISCHARGE PLAN: Return to prior living/home.  PATIENT/FAMILY INVOLVEMENT: This treatment plan has been presented to and reviewed with the patient, Norah Lebel.  The patient and family have been given the opportunity to ask questions and make suggestions.  Karren Burly, RN 05/26/2023, 6:55 PM
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
## 2023-05-26 NOTE — ED Notes (Signed)
PT belongings given to [name], authorized by pt

## 2023-05-26 NOTE — Progress Notes (Signed)
   05/26/23 1700  Psych Admission Type (Psych Patients Only)  Admission Status Voluntary  Psychosocial Assessment  Patient Complaints Insomnia;Irritability;Anger  Eye Contact Fair  Facial Expression Animated;Anxious  Affect Anxious;Appropriate to circumstance  Speech Logical/coherent  Interaction Assertive  Motor Activity Fidgety  Appearance/Hygiene In scrubs  Behavior Characteristics Cooperative  Mood Depressed;Anxious  Aggressive Behavior  Targets Self  Thought Process  Coherency Circumstantial  Content Blaming others  Delusions None reported or observed  Perception WDL  Hallucination None reported or observed  Judgment Impaired  Confusion None  Danger to Self  Current suicidal ideation? Denies  Danger to Others  Danger to Others None reported or observed

## 2023-05-26 NOTE — [facility] Group Notes (Signed)
[facility] Group Notes:  (Nursing/MHT/Case Management/Adjunct)  Date:  05/26/2023  Time:  2000  Type of Therapy:   Wrap up group  Participation Level:  Active  Participation Quality:  Appropriate, Attentive, Sharing, and Supportive  Affect:  Depressed and Flat  Cognitive:  Alert  Insight:  Improving  Engagement in Group:  Developing/Improving  Modes of Intervention:  Clarification, Education, and Support  Summary of Progress/Problems: Positive thinking and positive change were discussed.   Marcille Buffy 05/26/2023, 9:09 PM

## 2023-05-26 NOTE — ED Notes (Signed)
Patient off unit to [facility] per provider. Patient alert, calm, cooperative, no S/S of distress. Patient discharge information and belongings given to [employer].  Patient ambulated off unit, escorted by RN. Patient transported by [employer].

## 2023-05-27 DIAGNOSIS — F332 Major depressive disorder, recurrent severe without psychotic features: Secondary | ICD-10-CM | POA: Diagnosis present

## 2023-05-27 DIAGNOSIS — F4323 Adjustment disorder with mixed anxiety and depressed mood: Secondary | ICD-10-CM | POA: Diagnosis not present

## 2023-05-27 DIAGNOSIS — G4700 Insomnia, unspecified: Secondary | ICD-10-CM | POA: Diagnosis present

## 2023-05-27 MED ORDER — ESCITALOPRAM OXALATE 10 MG PO TABS
10.0000 mg | ORAL_TABLET | Freq: Every day | ORAL | Status: DC
  Administered 2023-05-28: 10 mg via ORAL
  Filled 2023-05-27 (×3): qty 1

## 2023-05-27 MED ORDER — ONDANSETRON 4 MG PO TBDP
4.0000 mg | ORAL_TABLET | Freq: Four times a day (QID) | ORAL | Status: DC | PRN
  Administered 2023-05-27: 4 mg via ORAL
  Filled 2023-05-27: qty 1

## 2023-05-27 MED ORDER — LOPERAMIDE HCL 2 MG PO CAPS: 2.0000 mg | ORAL_CAPSULE | ORAL | Status: DC | PRN

## 2023-05-27 MED ORDER — CLONIDINE HCL 0.1 MG PO TABS
0.1000 mg | ORAL_TABLET | Freq: Two times a day (BID) | ORAL | Status: DC | PRN
  Administered 2023-05-27: 0.1 mg via ORAL
  Filled 2023-05-27: qty 1

## 2023-05-27 MED ORDER — THIAMINE HCL 100 MG/ML IJ SOLN
100.0000 mg | Freq: Once | INTRAMUSCULAR | Status: AC
  Administered 2023-05-27: 100 mg via INTRAMUSCULAR
  Filled 2023-05-27: qty 2

## 2023-05-27 MED ORDER — ADULT MULTIVITAMIN W/MINERALS CH
1.0000 | ORAL_TABLET | Freq: Every day | ORAL | Status: DC
  Administered 2023-05-27 – 2023-05-28 (×2): 1 via ORAL
  Filled 2023-05-27 (×6): qty 1

## 2023-05-27 MED ORDER — PROPRANOLOL HCL 10 MG PO TABS
10.0000 mg | ORAL_TABLET | Freq: Two times a day (BID) | ORAL | Status: DC
  Administered 2023-05-27 – 2023-05-28 (×2): 10 mg via ORAL
  Filled 2023-05-27 (×8): qty 1

## 2023-05-27 MED ORDER — LORAZEPAM 1 MG PO TABS: 1.0000 mg | ORAL_TABLET | Freq: Four times a day (QID) | ORAL | Status: DC | PRN

## 2023-05-27 MED ORDER — VITAMIN B-1 100 MG PO TABS
100.0000 mg | ORAL_TABLET | Freq: Every day | ORAL | Status: DC
  Administered 2023-05-28: 100 mg via ORAL
  Filled 2023-05-27 (×4): qty 1

## 2023-05-27 NOTE — Group Note (Signed)
[facility] LCSW Group Therapy Note  Date/Time:  05/27/2023 10:00am-11:00am  Type of Therapy and Topic:  Group Therapy:  Healthy and Unhealthy Coping Skills and Supports  Participation Level:  Active   Description of Group:  Patients in this group explored the differences between healthy and unhealthy, specifically with coping skills and supports, using the worksheet below.  Many examples were provided by CSW and group members.  The emphasis was on providing hope for change.  A demonstration was given about how to set boundaries with family and/or friends, which patients expressed was beneficial.    Therapeutic Goals:   1)  compare healthy versus unhealthy coping skills and healthy versus unhealthy supports 2)  identify examples of each and demonstrate commonalities within the group  3)  generate ideas of healthy coping skills and supports that can be added  4)  discuss importance of adding healthy supports and setting boundaries with unhealthy supports  5)  offer mutual support about how to address unhealthy coping skills and supports  6)  encourage active participation in group   Healthy vs. Unhealthy  Coping Skills and Supports   Unhealthy Qualities                                             Healthy Qualities Works (at first) Works   Stops working or starts Interior and spatial designer working  Fast Usually takes time to develop  Easy Often difficult to learn  Often effortless, can be done without thought Usually takes effort, thinking about it  Usually a habit Usually unknown, has to become a habit  Can do alone Often need to reach out for help   Leads to loss Leads to gain         My Unhealthy Coping Skills                                    My Healthy Coping Skills                       My Unhealthy Supports                                           My Healthy Supports                       Summary of Patient Progress:  The patient expressed himself frequently, without  hesitation or anxiety, throughout the early part of group, but then he was called out by a provider and did not return to group.   Therapeutic Modalities:   Psychoeducation Brief Solution-Focused Therapy  Ambrose Mantle, LCSW

## 2023-05-27 NOTE — H&P (Cosign Needed Addendum)
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 or DBP over 100 -Continue hydroxyzine 25 mg TID for anxiety -Continue trazodone 50 mg nightly as needed for sleep   PRNS -Continue Tylenol 650 mg every 6 hours PRN for mild pain -Continue Maalox 30 mg every 4 hrs PRN for indigestion -Continue Milk of Magnesia as needed every 6 hrs for constipation  Patient educated on all medications including rationales, benefits, and possible side effects verbalizes understanding and agreeable to taking medications.  Labs reviewed: Ordered TSH, lipid panel, hemoglobin A1c, vitamin D levels for a.m.  on 9/16.  EKG sinus rhythm with QTc of 396.  CMP and CBC reviewed.  Discharge Planning: Social work and case management to assist with discharge planning and identification of hospital follow-up needs prior to discharge Estimated LOS: 5-7 days Discharge Concerns: Need to establish a safety plan; Medication compliance and effectiveness Discharge Goals: Return home with outpatient referrals for mental health follow-up including medication management/psychotherapy  I certify that inpatient services furnished can reasonably be expected to improve the patient's condition.    Starleen Blue, NP 9/15/20245:34 PM

## 2023-05-27 NOTE — Plan of Care (Signed)
  Problem: Education: Goal: Emotional status will improve Outcome: Progressing   Problem: Education: Goal: Mental status will improve Outcome: Progressing   Problem: Activity: Goal: Sleeping patterns will improve Outcome: Progressing   Problem: Coping: Goal: Ability to verbalize frustrations and anger appropriately will improve Outcome: Progressing   Problem: Safety: Goal: Periods of time without injury will increase Outcome: Progressing   Problem: Health Behavior/Discharge Planning: Goal: Compliance with treatment plan for underlying cause of condition will improve Outcome: Progressing
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
## 2023-05-27 NOTE — [facility] Group Notes (Signed)
[facility] Group Notes:  (Nursing/MHT/Case Management/Adjunct)  Date:  05/27/2023  Time:  2000  Type of Therapy:   Wrap up group  Participation Level:  Active  Participation Quality:  Appropriate, Attentive, Sharing, and Supportive  Affect:  Appropriate  Cognitive:  Alert  Insight:  Improving  Engagement in Group:  Engaged  Modes of Intervention:  Clarification, Education, and Socialization  Summary of Progress/Problems: Positive thinking and self-care were discussed.   Marcille Buffy 05/27/2023, 9:25 PM

## 2023-05-27 NOTE — Progress Notes (Signed)
Patient rated his anxiety level 5/10 and his depression level 0/10 with 10 being the highest and 0 none. Pt stated his goal for today as," Getting a therapist to continue seeing".  Pt denied SI/HI and AVH. Medication and group compliant. Pt stated, " I'm ready to go home I spoke with some of the other patients and I feel better". Pt observed interacting well with peers.Pt's blood pressure and HR noted to be elevated, prescribed Propanol and PRN Clonidine administered as ordered. Zofran 4 mg administered for complaints of nausea with effective results. BP at this time = 136/84, HR= 102. CIWA on shift=9. Request for discharge withdrawn 05/27/23 at 1200. Safety maintained.  05/27/23 0810  Psych Admission Type (Psych Patients Only)  Admission Status Voluntary/72 hour document signed  Psychosocial Assessment  Patient Complaints Anxiety  Eye Contact Fair  Facial Expression Animated;Anxious  Affect Appropriate to circumstance  Speech Logical/coherent  Interaction Assertive  Motor Activity Restless  Appearance/Hygiene In scrubs  Behavior Characteristics Anxious;Cooperative  Thought Process  Coherency WDL  Content WDL  Delusions None reported or observed  Perception WDL  Hallucination None reported or observed  Judgment Impaired  Confusion None  Danger to Self  Current suicidal ideation? Denies  Agreement Not to Harm Self Yes  Description of Agreement Verbal  Danger to Others  Danger to Others None reported or observed

## 2023-05-27 NOTE — [facility] Suicide Risk Assessment (Signed)
[facility] INPATIENT:  Family/Significant Other Suicide Prevention Education  Suicide Prevention Education:  Education Completed; Ryan [name],  (wife [phone]) has been identified by the patient as the family member/significant other with whom the patient will be residing, and identified as the person(s) who will aid the patient in the event of a mental health crisis (suicidal ideations/suicide attempt).  With written consent from the patient, the family member/significant other has been provided the following suicide prevention education, prior to the and/or following the discharge of the patient.  The suicide prevention education provided includes the following: Suicide risk factors Suicide prevention and interventions National Suicide Hotline telephone number [facility] assessment telephone number [facility] Emergency Assistance 911 [facility] and/or Residential Mobile Crisis Unit telephone number  Request made of family/significant other to: Remove weapons (e.g., guns, rifles, knives), all items previously/currently identified as safety concern.   Remove drugs/medications (over-the-counter, prescriptions, illicit drugs), all items previously/currently identified as a safety concern.  The family member/significant other verbalizes understanding of the suicide prevention education information provided.  The family member/significant other agrees to remove the items of safety concern listed above.  Ryan [name] 05/27/2023, 2:53 PM

## 2023-05-27 NOTE — Group Note (Signed)
Date:  05/27/2023 Time:  2:48 PM  Group Topic/Focus:  Goals Group:   The focus of this group is to help patients establish daily goals to achieve during treatment and discuss how the patient can incorporate goal setting into their daily lives to aide in recovery.    Participation Level:  Active  Participation Quality:  Appropriate  Affect:  Appropriate  Cognitive:  Appropriate  Insight: Appropriate  Engagement in Group:  Engaged  Modes of Intervention:  Discussion  Additional Comments:     Reymundo Poll 05/27/2023, 2:48 PM

## 2023-05-27 NOTE — Plan of Care (Signed)
Problem: Education: Goal: Verbalization of understanding the information provided will improve Outcome: Progressing   Problem: Activity: Goal: Sleeping patterns will improve Outcome: Progressing
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
## 2023-05-27 NOTE — Plan of Care (Signed)
Problem: Education: Goal: Emotional status will improve Outcome: Progressing Goal: Mental status will improve Outcome: Progressing   Problem: Activity: Goal: Interest or engagement in activities will improve Outcome: Progressing Goal: Sleeping patterns will improve Outcome: Progressing

## 2023-05-27 NOTE — Progress Notes (Signed)
   05/27/23 0513  15 Minute Checks  Location Bedroom  Visual Appearance Calm  Behavior Sleeping  Sleep (Behavioral Health Patients Only)  Calculate sleep? (Click Yes once per 24 hr at 0600 safety check) Yes  Documented sleep last 24 hours 6.75

## 2023-05-27 NOTE — Progress Notes (Signed)
   05/27/23 2200  Psychosocial Assessment  Patient Complaints Anxiety  Eye Contact Fair  Facial Expression Anxious  Affect Appropriate to circumstance  Speech Logical/coherent  Interaction Assertive  Motor Activity Slow  Appearance/Hygiene Unremarkable  Behavior Characteristics Cooperative  Mood Pleasant  Aggressive Behavior  Effect No apparent injury  Thought Process  Coherency WDL  Content WDL  Delusions WDL  Perception WDL  Hallucination None reported or observed  Judgment WDL  Confusion None  Danger to Self  Current suicidal ideation? Denies

## 2023-05-28 ENCOUNTER — Encounter (HOSPITAL_COMMUNITY): Payer: Self-pay

## 2023-05-28 DIAGNOSIS — F4323 Adjustment disorder with mixed anxiety and depressed mood: Secondary | ICD-10-CM | POA: Diagnosis not present

## 2023-05-28 LAB — LIPID PANEL
Cholesterol: 189 mg/dL (ref 0–200)
HDL: 74 mg/dL (ref >40)
LDL Cholesterol: 99 mg/dL (ref 0–99)
Total CHOL/HDL Ratio: 2.6 ratio
Triglycerides: 78 mg/dL (ref <150)
VLDL: 16 mg/dL (ref 0–40)

## 2023-05-28 LAB — TSH: TSH: 2.23 u[IU]/mL (ref 0.350–4.500)

## 2023-05-28 LAB — VITAMIN D 25 HYDROXY (VIT D DEFICIENCY, FRACTURES): Vit D, 25-Hydroxy: 32.58 ng/mL (ref 30–100)

## 2023-05-28 MED ORDER — HYDROXYZINE HCL 25 MG PO TABS: 25.0000 mg | ORAL_TABLET | Freq: Three times a day (TID) | ORAL | 0 refills | Status: DC | PRN

## 2023-05-28 MED ORDER — ESCITALOPRAM OXALATE 10 MG PO TABS: 10.0000 mg | ORAL_TABLET | Freq: Every day | ORAL | 0 refills | Status: DC

## 2023-05-28 MED ORDER — PROPRANOLOL HCL 10 MG PO TABS: 10.0000 mg | ORAL_TABLET | Freq: Two times a day (BID) | ORAL | 0 refills | Status: DC

## 2023-05-28 MED ORDER — TRAZODONE HCL 50 MG PO TABS: 50.0000 mg | ORAL_TABLET | Freq: Every evening | ORAL | 0 refills | Status: DC | PRN

## 2023-05-28 NOTE — Progress Notes (Signed)
   05/28/23 0555  15 Minute Checks  Location Bedroom  Visual Appearance Calm  Behavior Sleeping  Sleep (Behavioral Health Patients Only)  Calculate sleep? (Click Yes once per 24 hr at 0600 safety check) Yes  Documented sleep last 24 hours 6.25

## 2023-05-28 NOTE — Plan of Care (Signed)
Nurse discussed coping skills with patient.  

## 2023-05-28 NOTE — BHH Suicide Risk Assessment (Signed)
Suicide Risk Assessment  Discharge Assessment    [facility] Discharge Suicide Risk Assessment   Principal Problem: Adjustment disorder with mixed anxiety and depressed mood Discharge Diagnoses: Principal Problem:   Adjustment disorder with mixed anxiety and depressed mood Active Problems:   Mild tetrahydrocannabinol (THC) abuse   Insomnia  Reason for admission: Ryan [name]. Ryan [name] is a 27 year old Caucasian male with no prior formal mental health diagnoses who presented to the [facility] ED on 05/25/2023 via EMS for an alleged overdose on multiple medications in a suicide attempt. As per ER documentation, he took "6-7 50 mg unisom, 5 beers, and mariajuana".  Patient transferred to this behavioral health Hospital for treatment and stabilization of his mental status.  This is his first inpatient mental health related hospitalization. He reports 1 suicide attempt in the past at age 45 years old via overdosing.   During the patient's hospitalization, patient had extensive initial psychiatric evaluation, and follow-up psychiatric evaluations every day. Psychiatric diagnoses provided upon initial assessment: As listed above.  Patient's psychiatric medications were adjusted on admission:As follows: -Increase Lexapro from 5 mg to 10 mg starting 9/16 for MDD & anxiety -Start Ativan 1 mg every 6 hours as needed for CIWA scores over 10. -Start Inderal 10 mg twice daily for tachycardia/anxiety-States that he "shakes" at baseline -Start clonidine 0.1 mg twice daily as needed for SBP over 160 or DBP over 100 -Continue hydroxyzine 25 mg TID for anxiety -Continue trazodone 50 mg nightly as needed for sleep  During the hospitalization, other adjustments were made to the patient's psychiatric medication regimen.  Medications at discharge are as follows: -Continue Lexapro 10 mg daily for MDD and GAD -Continue hydroxyzine 25 mg 3 times daily as needed for GAD -Continue Inderal 10 mg twice daily for hypertension &  tachycardia -Milligrams nightly as needed for sleep  Patient's blood pressure has been significantly persistently elevated throughout this hospitalization, with SBP running in the 130s to 150s and DBP's running in the 90s to 120s.  Heart rate has also been elevated in the 90s to 120s.  Patient remains asymptomatic at discharge, has been educated on the need to follow up with his primary care provider, verbalizes understanding, and states that there is a significant history of hypertension in his family, and he is currently asymptomatic.  He has been provided with resources for the [facility] health and wellness, to set up an appointment for a primary care provider follow up should he not have one.  Patient also questioned extensively regarding his alcohol use, as there were concerns that elevations in his diastolic blood pressure might be related to his alcohol usage.  Patient denied excessive use of alcohol throughout hospitalization, and this was confirmed by his wife, who reported along with patient that he only drinks 1 beer per day, and has never had alcohol related withdrawals, never had any blackouts related to alcohol use, and never had any seizures related to alcohol use.  Patient was placed on the CIWA scores during course of hospitalization, and persistently scored low, with his highest score being a 5, not requiring the Ativan 1 mg that was ordered to be given for CIWA scores >10.   Patient's care was discussed during the interdisciplinary team meeting every day during the hospitalization.  The patient denies having side effects to prescribed psychiatric medication. Gradually, patient started adjusting to milieu. The patient was evaluated each day by a clinical provider to ascertain response to treatment. Improvement was noted by the patient's report of decreasing  symptoms, improved sleep and appetite, affect, medication tolerance, behavior, and participation in unit programming.  Patient was asked each  day to complete a self inventory noting mood, mental status, pain, new symptoms, anxiety and concerns.    Symptoms were reported as significantly decreased or resolved completely by discharge. On day of discharge, the patient reports that their mood is stable. The patient denied having suicidal thoughts for more than 48 hours prior to discharge.  Patient denies having homicidal thoughts.  Patient denies having auditory hallucinations.  Patient denies any visual hallucinations or other symptoms of psychosis. The patient was motivated to continue taking medication with a goal of continued improvement in mental health.   The patient reports their target psychiatric symptoms of depression, anxiety, insomnia responded well to the psychiatric medications, and the patient reports overall benefit from this psychiatric hospitalization. Supportive psychotherapy was provided to the patient. The patient also participated in regular group therapy while hospitalized. Coping skills, problem solving as well as relaxation therapies were also part of the unit programming.  Labs were reviewed with the patient, and abnormal results were discussed with the patient.  The patient is able to verbalize their individual safety plan to this provider.  # It is recommended to the patient to continue psychiatric medications as prescribed, after discharge from the hospital.    # It is recommended to the patient to follow up with your outpatient psychiatric provider and PCP.  # It was discussed with the patient, the impact of alcohol, drugs, tobacco have been there overall psychiatric and medical wellbeing, and total abstinence from substance use was recommended the patient.ed.  # Prescriptions provided or sent directly to preferred pharmacy at discharge. Patient agreeable to plan. Given opportunity to ask questions. Appears to feel comfortable with discharge.    # In the event of worsening symptoms, the patient is instructed to  call the crisis hotline (988), 911 and or go to the nearest ED for appropriate evaluation and treatment of symptoms. To follow-up with primary care provider for other medical issues, concerns and or health care needs  # Patient was discharged home with a plan to follow up as noted below.    Total Time spent with patient: 45 minutes  Musculoskeletal: Strength & Muscle Tone: within normal limits Gait & Station: normal Patient leans: N/A  Psychiatric Specialty Exam  Presentation  General Appearance:  Appropriate for Environment; Casual  Eye Contact: Good  Speech: Clear and Coherent  Speech Volume: Normal  Handedness: Right   Mood and Affect  Mood: Euthymic  Duration of Depression Symptoms: No data recorded Affect: Congruent   Thought Process  Thought Processes: Coherent  Descriptions of Associations:Intact  Orientation:Full (Time, Place and Person)  Thought Content:Logical  History of Schizophrenia/Schizoaffective disorder:No data recorded Duration of Psychotic Symptoms:No data recorded Hallucinations:Hallucinations: None  Ideas of Reference:None  Suicidal Thoughts:Suicidal Thoughts: No  Homicidal Thoughts:Homicidal Thoughts: No   Sensorium  Memory: Immediate Fair  Judgment: Fair  Insight: Fair   Art therapist  Concentration: Fair  Attention Span: Fair  Recall: Fair  Fund of Knowledge: Fair  Language: Good   Psychomotor Activity  Psychomotor Activity: Psychomotor Activity: Normal   Assets  Assets: Resilience   Sleep  Sleep: Sleep: Fair   Physical Exam: Physical Exam Constitutional:      Appearance: Normal appearance.  Musculoskeletal:     Cervical back: Normal range of motion.  Neurological:     Mental Status: He is alert and oriented to person, place, and time.  Review of Systems  Constitutional: Negative.   HENT: Negative.    Eyes: Negative.   Respiratory: Negative.    Cardiovascular:  Negative.   Gastrointestinal: Negative.   Genitourinary: Negative.   Musculoskeletal: Negative.   Skin: Negative.   Neurological: Negative.   Psychiatric/Behavioral:  Positive for depression (Denies SI/HI, denies intent and plan) and substance abuse (Educated on the negative impact of substance use on his mental health and on the need to cease using). Negative for hallucinations, memory loss and suicidal ideas. The patient is nervous/anxious (Resolving) and has insomnia (REsolving).    Blood pressure (!) 157/102, pulse 90, temperature 97.6 F (36.4 C), temperature source Oral, resp. rate 20, height 5\' 11"  (1.803 m), weight 57.6 kg, SpO2 100%. Body mass index is 17.71 kg/m.  Mental Status Per Nursing Assessment::   On Admission:  Suicidal ideation indicated by others, Suicide plan, Plan includes specific time, place, or method  Demographic Factors:  Male, Caucasian, and Low socioeconomic status  Loss Factors: Financial problems/change in socioeconomic status  Historical Factors: Family history of mental illness or substance abuse  Risk Reduction Factors:   Sense of responsibility to family, Religious beliefs about death, Employed, Living with another person, especially a relative, and Positive social support  Continued Clinical Symptoms:  Alcohol/Substance Abuse/Dependencies  Cognitive Features That Contribute To Risk:  None    Suicide Risk:  Mild:  There are no identifiable suicide plans, no associated intent, mild dysphoria and related symptoms, good self-control (both objective and subjective assessment), few other risk factors, and identifiable protective factors, including available and accessible social support.  Patient provided verbal consent for his mother to be called, he did not have mother's phone number, but consented for writer to reach out to his wife to obtain mother's number.  Number provided was [phone].  Pleasant and underlying self identified as Ryan [name], stated that she was patient's mother.  She was able to provide patient's correct date of birth for his privacy prior to call continuing.  Mother was educated that call was in an effort to confirm events leading to hospitalization, since she was the one that triggered this hospitalization.   Patient's mother stated that on the day of the hospitalization, patient called her and stated that "he wanted to take some pills and go to sleep, and he also stated that he was tired, and he hung up, and I called to have the police check on him because I was worried, because he would not answer the phone, and I did not know what going to sleep meant, as I have had a few of people in my family who have killed themselves."  Patient's mother added that she was concerned assumed that patient was suicidal, and called law enforcement to check on him.  Patient mother states that she was wrong, and this is not the case.  She states that patient has ""plenty of support at home", and also states that she is comfortable with patient being discharged today to the care of his wife.  Patient's mother states that patient has not been suicidal, and has not attempted suicide since he was in high school.  She states that patient had a suicide attempt while in high school, had therapy extensively, and has been fine since then.  She denies any concerns related to patient being discharged today.  Patient is being discharged with resources as listed below for continuity of care outside of the hospital.    Follow-up Information  [facility]. Go to.   Specialty: Behavioral Health Why: Please go to this provider for an assessment, to obtain therapy and medication management services. For fastest service, please go Monday through Friday, arrive by 7:00 am for same day service. Contact information: [address] [place] [address] [phone]        [facility]. Schedule an appointment as soon as possible for a visit in 1 day(s).   Why: Please call to follow up regarding hypertension follow up and to establish services with a primary care provider Contact information: [address] [place] [address] [phone]                Starleen Blue, NP 05/28/2023, 3:07 PM

## 2023-05-28 NOTE — Progress Notes (Signed)
  [facility] Adult Case Management Discharge Plan :  Will you be returning to the same living situation after discharge:  Yes,  pt will be returning home with wife. At discharge, do you have transportation home?: Yes,  Wife will pick Pt up Do you have the ability to pay for your medications: Yes,  Medicaid [employer]  Release of information consent forms completed and in the chart;  Patient's signature needed at discharge.  Patient to Follow up at:  Follow-up Information     [facility]. Go to.   Specialty: Behavioral Health Why: Please go to this provider for an assessment, to obtain therapy and medication management services. For fastest service, please go Monday through Friday, arrive by 7:00 am for same day service. Contact information: [address] [place] [address] [phone]                Next level of care provider has access to [facility] Link:no  Safety Planning and Suicide Prevention discussed: Yes,  Wife [name] [phone]     Has patient been referred to the Quitline?: Patient does not use tobacco/nicotine products  Patient has been referred for addiction treatment: No known substance use disorder.  Izell [place], LCSW 05/28/2023, 2:19 PM

## 2023-05-28 NOTE — Discharge Instructions (Signed)

## 2023-05-28 NOTE — BH IP Treatment Plan (Signed)
Interdisciplinary Treatment and Diagnostic Plan Update  05/28/2023 Time of Session: 11:40AM Jerimy Wolinsky MRN: [id number]  Principal Diagnosis: Adjustment disorder with mixed anxiety and depressed mood  Secondary Diagnoses: Principal Problem:   Adjustment disorder with mixed anxiety and depressed mood Active Problems:   Mild tetrahydrocannabinol (THC) abuse   Insomnia   Current Medications:  Current Facility-Administered Medications  Medication Dose Route Frequency Provider Last Rate Last Admin   acetaminophen (TYLENOL) tablet 650 mg  650 mg Oral Q6H PRN Dahlia Byes C, NP       alum & mag hydroxide-simeth (MAALOX/MYLANTA) 200-200-20 MG/5ML suspension 30 mL  30 mL Oral Q4H PRN Dahlia Byes C, NP       cloNIDine (CATAPRES) tablet 0.1 mg  0.1 mg Oral BID PRN Starleen Blue, NP   0.1 mg at 05/27/23 1735   diphenhydrAMINE (BENADRYL) capsule 50 mg  50 mg Oral TID PRN Earney Navy, NP       Or   diphenhydrAMINE (BENADRYL) injection 50 mg  50 mg Intramuscular TID PRN Earney Navy, NP       escitalopram (LEXAPRO) tablet 10 mg  10 mg Oral Daily Starleen Blue, NP   10 mg at 05/28/23 0836   haloperidol (HALDOL) tablet 5 mg  5 mg Oral TID PRN Earney Navy, NP       Or   haloperidol lactate (HALDOL) injection 5 mg  5 mg Intramuscular TID PRN Earney Navy, NP       hydrOXYzine (ATARAX) tablet 25 mg  25 mg Oral TID PRN Nwoko, Uchenna E, PA   25 mg at 05/27/23 2114   loperamide (IMODIUM) capsule 2-4 mg  2-4 mg Oral PRN Starleen Blue, NP       LORazepam (ATIVAN) tablet 2 mg  2 mg Oral TID PRN Dahlia Byes C, NP       Or   LORazepam (ATIVAN) injection 2 mg  2 mg Intramuscular TID PRN Dahlia Byes C, NP       LORazepam (ATIVAN) tablet 1 mg  1 mg Oral Q6H PRN Starleen Blue, NP       multivitamin with minerals tablet 1 tablet  1 tablet Oral Daily Nkwenti, Tyler Aas, NP   1 tablet at 05/28/23 0836   ondansetron (ZOFRAN-ODT) disintegrating tablet 4 mg   4 mg Oral Q6H PRN Starleen Blue, NP   4 mg at 05/27/23 1823   propranolol (INDERAL) tablet 10 mg  10 mg Oral BID Starleen Blue, NP   10 mg at 05/28/23 0836   thiamine (Vitamin B-1) tablet 100 mg  100 mg Oral Daily Starleen Blue, NP   100 mg at 05/28/23 0837   traZODone (DESYREL) tablet 50 mg  50 mg Oral QHS PRN Nwoko, Uchenna E, PA   50 mg at 05/27/23 2114   PTA Medications: Medications Prior to Admission  Medication Sig Dispense Refill Last Dose   diphenhydrAMINE HCl, Sleep, (UNISOM SLEEPGELS) 50 MG CAPS Take by mouth.      ibuprofen (ADVIL) 200 MG tablet Take 400 mg by mouth as needed for moderate pain.       Patient Stressors: Marital or family conflict    Patient Strengths: Ability for insight  Active sense of humor  Average or above average intelligence  Communication skills  General fund of knowledge  Motivation for treatment/growth  Physical Health  Special hobby/interest  Supportive family/friends  Work skills   Treatment Modalities: Medication Management, Group therapy, Case management,  1 to 1 session with clinician,  Psychoeducation, Recreational therapy.   Physician Treatment Plan for Primary Diagnosis: Adjustment disorder with mixed anxiety and depressed mood Long Term Goal(s): Improvement in symptoms so as ready for discharge   Short Term Goals: Ability to verbalize feelings will improve Ability to disclose and discuss suicidal ideas Ability to demonstrate self-control will improve Ability to identify and develop effective coping behaviors will improve Ability to maintain clinical measurements within normal limits will improve Compliance with prescribed medications will improve Ability to identify triggers associated with substance abuse/mental health issues will improve  Medication Management: Evaluate patient's response, side effects, and tolerance of medication regimen.  Therapeutic Interventions: 1 to 1 sessions, Unit Group sessions and Medication  administration.  Evaluation of Outcomes: Progressing  Physician Treatment Plan for Secondary Diagnosis: Principal Problem:   Adjustment disorder with mixed anxiety and depressed mood Active Problems:   Mild tetrahydrocannabinol (THC) abuse   Insomnia  Long Term Goal(s): Improvement in symptoms so as ready for discharge   Short Term Goals: Ability to verbalize feelings will improve Ability to disclose and discuss suicidal ideas Ability to demonstrate self-control will improve Ability to identify and develop effective coping behaviors will improve Ability to maintain clinical measurements within normal limits will improve Compliance with prescribed medications will improve Ability to identify triggers associated with substance abuse/mental health issues will improve     Medication Management: Evaluate patient's response, side effects, and tolerance of medication regimen.  Therapeutic Interventions: 1 to 1 sessions, Unit Group sessions and Medication administration.  Evaluation of Outcomes: Progressing   RN Treatment Plan for Primary Diagnosis: Adjustment disorder with mixed anxiety and depressed mood Long Term Goal(s): Knowledge of disease and therapeutic regimen to maintain health will improve  Short Term Goals: Ability to remain free from injury will improve, Ability to verbalize frustration and anger appropriately will improve, Ability to participate in decision making will improve, Ability to verbalize feelings will improve, Ability to identify and develop effective coping behaviors will improve, and Compliance with prescribed medications will improve  Medication Management: RN will administer medications as ordered by provider, will assess and evaluate patient's response and provide education to patient for prescribed medication. RN will report any adverse and/or side effects to prescribing provider.  Therapeutic Interventions: 1 on 1 counseling sessions, Psychoeducation,  Medication administration, Evaluate responses to treatment, Monitor vital signs and CBGs as ordered, Perform/monitor CIWA, COWS, AIMS and Fall Risk screenings as ordered, Perform wound care treatments as ordered.  Evaluation of Outcomes: Progressing   LCSW Treatment Plan for Primary Diagnosis: Adjustment disorder with mixed anxiety and depressed mood Long Term Goal(s): Safe transition to appropriate next level of care at discharge, Engage patient in therapeutic group addressing interpersonal concerns.  Short Term Goals: Engage patient in aftercare planning with referrals and resources, Increase social support, Increase emotional regulation, Facilitate acceptance of mental health diagnosis and concerns, Identify triggers associated with mental health/substance abuse issues, and Increase skills for wellness and recovery  Therapeutic Interventions: Assess for all discharge needs, 1 to 1 time with Social worker, Explore available resources and support systems, Assess for adequacy in community support network, Educate family and significant other(s) on suicide prevention, Complete Psychosocial Assessment, Interpersonal group therapy.  Evaluation of Outcomes: Progressing   Progress in Treatment: Attending groups: Yes. Participating in groups: Yes. Taking medication as prescribed: Yes. Toleration medication: Yes. Family/Significant other contact made: Yes, individual(s) contacted:  Wife [name] [phone] Patient understands diagnosis: Yes. Discussing patient identified problems/goals with staff: Yes. Medical problems stabilized or resolved: Yes. Denies  suicidal/homicidal ideation: Yes. Issues/concerns per patient self-inventory: No.  New problem(s) identified: No, Describe:  none reported  New Short Term/Long Term Goal(s):medication stabilization, elimination of SI thoughts, development of comprehensive mental wellness plan.    Patient Goals:  "to find therapy outside of the  hospital and find a doctor for my blood pressure."  Discharge Plan or Barriers: Patient recently admitted. CSW will continue to follow and assess for appropriate referrals and possible discharge planning.    Reason for Continuation of Hospitalization: Depression Medication stabilization  Estimated Length of Stay:0-1 days  Last 3 [place] Suicide Severity Risk Score: Flowsheet Row [facility] (Current) [facility] ED from 02/12/2023 in [facility])  C-SSRS RISK CATEGORY High Risk High Risk No Risk       Last PHQ 2/9 Scores:    01/13/2021    4:15 PM 01/13/2021    4:04 PM  Depression screen PHQ 2/9  Decreased Interest 3 3  Down, Depressed, Hopeless 3 3  PHQ - 2 Score 6 6  Altered sleeping 3   Tired, decreased energy 2   Change in appetite 2   Feeling bad or failure about yourself  3   Trouble concentrating 2   Moving slowly or fidgety/restless 1   Suicidal thoughts 1   PHQ-9 Score 20   Difficult doing work/chores Somewhat difficult     Scribe for Treatment Team: Izell Laurens, LCSW 05/28/2023 2:02 PM

## 2023-05-28 NOTE — Group Note (Signed)
Date:  05/28/2023 Time:  9:50 AM  Group Topic/Focus:  Goals Group:   The focus of this group is to help patients establish daily goals to achieve during treatment and discuss how the patient can incorporate goal setting into their daily lives to aide in recovery. Orientation:   The focus of this group is to educate the patient on the purpose and policies of crisis stabilization and provide a format to answer questions about their admission.  The group details unit policies and expectations of patients while admitted.    Participation Level:  Active  Participation Quality:  Attentive  Affect:  Appropriate  Cognitive:  Appropriate  Insight: Appropriate  Engagement in Group:  Engaged  Modes of Intervention:  Discussion  Additional Comments:  Patient attended goals group and was attentive the duration of it. Patient's goal was to attend all groups.  Kent Braunschweig T Lorraine Lax 05/28/2023, 9:50 AM

## 2023-05-28 NOTE — Progress Notes (Signed)
Discharge Note:  Patient discharged home with wife.  Suicide prevention information given and discussed with patient who stated he understood and had no questions.  Denied SI and HI.  Denied A/V hallucinations.  Patient stated he received all his belongings, clothing, toiletries, etc.  Patient stated he appreciated all assistance received from [facility] staff.  All required discharge information given.

## 2023-05-28 NOTE — Group Note (Signed)
Recreation Therapy Group Note   Group Topic:Team Building  Group Date: 05/28/2023 Start Time: 0935 End Time: 0955 Facilitators: Zaccheus Edmister-McCall, LRT,CTRS Location: [address] Day[address]   Goal Area(s) Addresses:  Patient will effectively work with peer towards shared goal.  Patient will identify skills used to make activity successful.  Patient will identify how skills used during activity can be applied to reach post d/c goals.   Intervention: STEM Activity- Glass blower/designer  Group Description: Tallest Pharmacist, community. In teams of 5-6, patients were given 11 craft pipe cleaners. Using the materials provided, patients were instructed to compete again the opposing team(s) to build the tallest free-standing structure from floor level. The activity was timed; difficulty increased by Clinical research associate as Production designer, theatre/television/film continued.  Systematically resources were removed with additional directions for example, placing one arm behind their back, working in silence, and shape stipulations. LRT facilitated post-activity discussion reviewing team processes and necessary communication skills involved in completion. Patients were encouraged to reflect how the skills utilized, or not utilized, in this activity can be incorporated to positively impact support systems post discharge.   Affect/Mood: N/A   Participation Level: Did not attend    Clinical Observations/Individualized Feedback:    Plan: Continue to engage patient in RT group sessions 2-3x/week.   Bon Dowis-McCall, LRT,CTRS 05/28/2023 12:23 PM

## 2023-05-28 NOTE — Progress Notes (Signed)
Patient denied SI and HI, contracts for safety.  Denied A/V hallucinations.   Medications administered per MD orders.  Emotional support and encouragement given patient. Safety maintained with 15 minute checks.

## 2023-05-29 LAB — HEMOGLOBIN A1C
Hgb A1c MFr Bld: 5 % (ref 4.8–5.6)
Mean Plasma Glucose: 97 mg/dL

## 2023-05-29 NOTE — [facility] Group Notes (Signed)
Spirituality group facilitated by Kathleen Argue, BCC.  Group Description: Group focused on topic of hope. Patients participated in facilitated discussion around topic, connecting with one another around experiences and definitions for hope. Group members engaged with visual explorer photos, reflecting on what hope looks like for them today. Group engaged in discussion around how their definitions of hope are present today in hospital.  Modalities: Psycho-social ed, Adlerian, Narrative, MI  Patient Progress: Gilead attended group and actively engaged and participated in group conversation and activities.  His comments demonstrated good insight and contributed positively to the group.  He was supportive of peers.

## 2023-05-29 NOTE — Discharge Summary (Signed)
Physician Discharge Summary Note  Patient:  Ryan [name] is an 27 y.o., male MRN:  [id number] DOB:  [birth year] Patient phone:  [phone] (home)  Patient address:   [address] [place] [address],  Total Time spent with patient: 45 minutes  Date of Admission:  05/26/2023 Date of Discharge: 05/28/2023  Reason for Admission:  Ryan [name] is a 53 year old Caucasian male with no prior formal mental health diagnoses who presented to the [facility] ED on 05/25/2023 via EMS for an alleged overdose on multiple medications in a suicide attempt. As per ER documentation, he took "6-7 50 mg unisom, 5 beers, and mariajuana".  Patient transferred to this behavioral health Hospital for treatment and stabilization of his mental status.  This is his first inpatient mental health related hospitalization. He reports 1 suicide attempt in the past at age 21 years old via overdosing.   Principal Problem: Adjustment disorder with mixed anxiety and depressed mood Discharge Diagnoses: Principal Problem:   Adjustment disorder with mixed anxiety and depressed mood Active Problems:   Mild tetrahydrocannabinol (THC) abuse   Insomnia  Past Psychiatric History: See H & P  Past Medical History:  Past Medical History:  Diagnosis Date   Anxiety    Asthma    Depression     Past Surgical History:  Procedure Laterality Date   CIRCUMCISION  44   Family History:  Family History  Problem Relation Age of Onset   Heart murmur Mother    Clotting disorder Father    Other Maternal Grandmother        Died at 44 due to blood clots   Diabetes Maternal Grandmother    Clotting disorder Paternal Grandfather        blood clot   Heart failure Paternal Grandfather    Heart attack Paternal Aunt    Family Psychiatric  History: See H & P Social History:  Social History   Substance and Sexual Activity  Alcohol Use Yes   Alcohol/week: 2.0 standard drinks of alcohol   Types: 2 Cans of beer per  week   Comment: daily     Social History   Substance and Sexual Activity  Drug Use Yes   Types: Marijuana    Social History   Socioeconomic History   Marital status: Married    Spouse name: Not on file   Number of children: Not on file   Years of education: Not on file   Highest education level: Not on file  Occupational History   Not on file  Tobacco Use   Smoking status: Never    Passive exposure: Past   Smokeless tobacco: Never   Tobacco comments:    Marijuana  Vaping Use   Vaping status: Never Used  Substance and Sexual Activity   Alcohol use: Yes    Alcohol/week: 2.0 standard drinks of alcohol    Types: 2 Cans of beer per week    Comment: daily   Drug use: Yes    Types: Marijuana   Sexual activity: Yes    Birth control/protection: None  Other Topics Concern   Not on file  Social History Narrative   Works at [employer] for 4 months, loves his job.   Social Determinants of Health   Financial Resource Strain: Not on file  Food Insecurity: No Food Insecurity (05/26/2023)   Hunger Vital Sign    Worried About Running Out of Food in the Last Year: Never true    Ran Out of Food in  the Last Year: Never true  Transportation Needs: No Transportation Needs (05/26/2023)   PRAPARE - Administrator, Civil Service (Medical): No    Lack of Transportation (Non-Medical): No  Physical Activity: Not on file  Stress: Not on file  Social Connections: Not on file   Hospital Course:   During the patient's hospitalization, patient had extensive initial psychiatric evaluation, and follow-up psychiatric evaluations every day. Psychiatric diagnoses provided upon initial assessment: As listed above.   Patient's psychiatric medications were adjusted on admission:As follows: -Increase Lexapro from 5 mg to 10 mg starting 9/16 for MDD & anxiety -Start Ativan 1 mg every 6 hours as needed for CIWA scores over 10. -Start Inderal 10 mg twice daily for tachycardia/anxiety-States  that he "shakes" at baseline -Start clonidine 0.1 mg twice daily as needed for SBP over 160 or DBP over 100 -Continue hydroxyzine 25 mg TID for anxiety -Continue trazodone 50 mg nightly as needed for sleep   During the hospitalization, other adjustments were made to the patient's psychiatric medication regimen.  Medications at discharge are as follows: -Continue Lexapro 10 mg daily for MDD and GAD -Continue hydroxyzine 25 mg 3 times daily as needed for GAD -Continue Inderal 10 mg twice daily for hypertension & tachycardia -Milligrams nightly as needed for sleep   Patient's blood pressure has been significantly persistently elevated throughout this hospitalization, with SBP running in the 130s to 150s and DBP's running in the 90s to 120s.  Heart rate has also been elevated in the 90s to 120s.  Patient remains asymptomatic at discharge, has been educated on the need to follow up with his primary care provider, verbalizes understanding, and states that there is a significant history of hypertension in his family, and he is currently asymptomatic.  He has been provided with resources for the [facility] health and wellness, to set up an appointment for a primary care provider follow up should he not have one.  Patient also questioned extensively regarding his alcohol use, as there were concerns that elevations in his diastolic blood pressure might be related to his alcohol usage.  Patient denied excessive use of alcohol throughout hospitalization, and this was confirmed by his wife, who reported along with patient that he only drinks 1 beer per day, and has never had alcohol related withdrawals, never had any blackouts related to alcohol use, and never had any seizures related to alcohol use.  Patient was placed on the CIWA scores during course of hospitalization, and persistently scored low, with his highest score being a 5, not requiring the Ativan 1 mg that was ordered to be given for CIWA scores >10.     Patient's care was discussed during the interdisciplinary team meeting every day during the hospitalization.  The patient denies having side effects to prescribed psychiatric medication. Gradually, patient started adjusting to milieu. The patient was evaluated each day by a clinical provider to ascertain response to treatment. Improvement was noted by the patient's report of decreasing symptoms, improved sleep and appetite, affect, medication tolerance, behavior, and participation in unit programming.  Patient was asked each day to complete a self inventory noting mood, mental status, pain, new symptoms, anxiety and concerns.     Symptoms were reported as significantly decreased or resolved completely by discharge. On day of discharge, the patient reports that their mood is stable. The patient denied having suicidal thoughts for more than 48 hours prior to discharge.  Patient denies having homicidal thoughts.  Patient denies having auditory hallucinations.  Patient denies any visual hallucinations or other symptoms of psychosis. The patient was motivated to continue taking medication with a goal of continued improvement in mental health.    The patient reports their target psychiatric symptoms of depression, anxiety, insomnia responded well to the psychiatric medications, and the patient reports overall benefit from this psychiatric hospitalization. Supportive psychotherapy was provided to the patient. The patient also participated in regular group therapy while hospitalized. Coping skills, problem solving as well as relaxation therapies were also part of the unit programming.   Labs were reviewed with the patient, and abnormal results were discussed with the patient.   The patient is able to verbalize their individual safety plan to this provider.   # It is recommended to the patient to continue psychiatric medications as prescribed, after discharge from the hospital.     # It is recommended to the  patient to follow up with your outpatient psychiatric provider and PCP.   # It was discussed with the patient, the impact of alcohol, drugs, tobacco have been there overall psychiatric and medical wellbeing, and total abstinence from substance use was recommended the patient.ed.   # Prescriptions provided or sent directly to preferred pharmacy at discharge. Patient agreeable to plan. Given opportunity to ask questions. Appears to feel comfortable with discharge.    # In the event of worsening symptoms, the patient is instructed to call the crisis hotline (988), 911 and or go to the nearest ED for appropriate evaluation and treatment of symptoms. To follow-up with primary care provider for other medical issues, concerns and or health care needs   # Patient was discharged home with a plan to follow up as noted below.     Total Time spent with patient: 45 minutes Physical Findings: AIMS:0 CIWA:  CIWA-Ar Total: 5 COWS:  n/a  Musculoskeletal: Strength & Muscle Tone: within normal limits Gait & Station: normal Patient leans: N/A  Psychiatric Specialty Exam:  Presentation  General Appearance:  Appropriate for Environment; Casual  Eye Contact: Good  Speech: Clear and Coherent  Speech Volume: Normal  Handedness: Right   Mood and Affect  Mood: Euthymic  Affect: Congruent   Thought Process  Thought Processes: Coherent  Descriptions of Associations:Intact  Orientation:Full (Time, Place and Person)  Thought Content:Logical  History of Schizophrenia/Schizoaffective disorder:No data recorded Duration of Psychotic Symptoms:No data recorded Hallucinations:Hallucinations: None  Ideas of Reference:None  Suicidal Thoughts:Suicidal Thoughts: No  Homicidal Thoughts:Homicidal Thoughts: No   Sensorium  Memory: Immediate Fair  Judgment: Fair  Insight: Fair  Art therapist  Concentration: Fair  Attention Span: Fair  Recall: Fair  Fund of  Knowledge: Fair  Language: Good  Psychomotor Activity  Psychomotor Activity: Psychomotor Activity: Normal  Assets  Assets: Resilience  Sleep  Sleep: Sleep: Fair  Physical Exam: Physical Exam Review of Systems  Psychiatric/Behavioral:  Positive for depression (Denies SI/HI, denies plan or intent to harm self or any one else outside of [place]) and substance abuse (Educated on the negative impacts of ETOH and THC on his mental health). Negative for hallucinations, memory loss and suicidal ideas. The patient is nervous/anxious (Resolving) and has insomnia (Resolving on meds).    Blood pressure (!) 157/102, pulse 90, temperature 97.6 F (36.4 C), temperature source Oral, resp. rate 20, height 5\' 11"  (1.803 m), weight 57.6 kg, SpO2 100%. Body mass index is 17.71 kg/m.   Social History   Tobacco Use  Smoking Status Never   Passive exposure: Past  Smokeless Tobacco Never  Tobacco  Comments   Marijuana   Tobacco Cessation:  N/A, patient does not currently use tobacco products  Blood Alcohol level:  Lab Results  Component Value Date   ETH 165 (H) 05/25/2023   ETH <10 01/11/2021   Metabolic Disorder Labs:  Lab Results  Component Value Date   HGBA1C 5.0 05/28/2023   MPG 97 05/28/2023   No results found for: "PROLACTIN" Lab Results  Component Value Date   CHOL 189 05/28/2023   TRIG 78 05/28/2023   HDL 74 05/28/2023   CHOLHDL 2.6 05/28/2023   VLDL 16 05/28/2023   LDLCALC 99 05/28/2023    See Psychiatric Specialty Exam and Suicide Risk Assessment completed by Attending Physician prior to discharge.  Discharge destination:  Home  Is patient on multiple antipsychotic therapies at discharge:  No   Has Patient had three or more failed trials of antipsychotic monotherapy by history:  No  Recommended Plan for Multiple Antipsychotic Therapies: NA  Discharge Instructions     Diet - low sodium heart healthy   Complete by: As directed    Increase activity slowly    Complete by: As directed       Allergies as of 05/28/2023   No Known Allergies      Medication List     STOP taking these medications    ibuprofen 200 MG tablet Commonly known as: ADVIL   Unisom Sleepgels 50 MG Caps Generic drug: diphenhydrAMINE HCl (Sleep)       TAKE these medications      Indication  escitalopram 10 MG tablet Commonly known as: LEXAPRO Take 1 tablet (10 mg total) by mouth daily.  Indication: Major Depressive Disorder   hydrOXYzine 25 MG tablet Commonly known as: ATARAX Take 1 tablet (25 mg total) by mouth 3 (three) times daily as needed for up to 10 doses for anxiety.  Indication: Feeling Anxious   propranolol 10 MG tablet Commonly known as: INDERAL Take 1 tablet (10 mg total) by mouth 2 (two) times daily.  Indication: Feeling Anxious, High Blood Pressure   traZODone 50 MG tablet Commonly known as: DESYREL Take 1 tablet (50 mg total) by mouth at bedtime as needed for up to 10 days for sleep.  Indication: Trouble Sleeping        Follow-up Information     [facility]. Go on 06/11/2023.   Specialty: Behavioral Health Why: You have an appointment for medication management services on 06/11/23 at 1:00 pm, in person.  You also have an appointment for therapy services on 07/25/23 at 1:00 pm, in person.  For faster service, please go Monday through Friday, arrive by 7:00 am for same day service. Contact information: [address] [place] [address] [phone]        [facility]. Schedule an appointment as soon as possible for a visit in 1 day(s).   Why: Please call to follow up regarding hypertension follow up and to establish services with a primary care provider Contact information: [address] [place] [address] [phone]               Signed: Starleen Blue, NP 05/29/2023, 9:03 AM

## 2023-06-05 ENCOUNTER — Ambulatory Visit (INDEPENDENT_AMBULATORY_CARE_PROVIDER_SITE_OTHER): Payer: Medicaid Other | Admitting: Psychiatry

## 2023-06-05 ENCOUNTER — Encounter (HOSPITAL_COMMUNITY): Payer: Self-pay | Admitting: Psychiatry

## 2023-06-05 VITALS — BP 144/87 | HR 59 | Wt 133.8 lb

## 2023-06-05 DIAGNOSIS — F411 Generalized anxiety disorder: Secondary | ICD-10-CM

## 2023-06-05 DIAGNOSIS — Z0000 Encounter for general adult medical examination without abnormal findings: Secondary | ICD-10-CM

## 2023-06-05 DIAGNOSIS — F32A Depression, unspecified: Secondary | ICD-10-CM

## 2023-06-05 MED ORDER — ESCITALOPRAM OXALATE 10 MG PO TABS: 10.0000 mg | ORAL_TABLET | Freq: Every day | ORAL | 3 refills | Status: DC

## 2023-06-05 MED ORDER — HYDROXYZINE HCL 25 MG PO TABS: 25.0000 mg | ORAL_TABLET | Freq: Three times a day (TID) | ORAL | 3 refills | Status: DC | PRN

## 2023-06-05 MED ORDER — PROPRANOLOL HCL 10 MG PO TABS
10.0000 mg | ORAL_TABLET | Freq: Two times a day (BID) | ORAL | 3 refills | Status: DC
Start: 2023-06-05 — End: 2023-08-28

## 2023-06-05 MED ORDER — TRAZODONE HCL 50 MG PO TABS: 50.0000 mg | ORAL_TABLET | Freq: Every evening | ORAL | 3 refills | Status: DC | PRN
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
## 2023-06-06 ENCOUNTER — Ambulatory Visit (INDEPENDENT_AMBULATORY_CARE_PROVIDER_SITE_OTHER): Payer: Medicaid Other | Admitting: Licensed Clinical Social Worker

## 2023-06-06 DIAGNOSIS — F331 Major depressive disorder, recurrent, moderate: Secondary | ICD-10-CM | POA: Insufficient documentation

## 2023-06-06 DIAGNOSIS — F411 Generalized anxiety disorder: Secondary | ICD-10-CM | POA: Insufficient documentation

## 2023-06-06 DIAGNOSIS — F1021 Alcohol dependence, in remission: Secondary | ICD-10-CM | POA: Diagnosis not present
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
## 2023-06-08 ENCOUNTER — Encounter: Payer: Self-pay | Admitting: Emergency Medicine

## 2023-06-08 ENCOUNTER — Ambulatory Visit
Admission: EM | Admit: 2023-06-08 | Discharge: 2023-06-08 | Disposition: A | Discharge status: Home / Self Care | Payer: Medicaid Other

## 2023-06-08 DIAGNOSIS — R058 Other specified cough: Secondary | ICD-10-CM | POA: Diagnosis not present

## 2023-06-08 DIAGNOSIS — Z20822 Contact with and (suspected) exposure to covid-19: Secondary | ICD-10-CM

## 2023-06-08 DIAGNOSIS — R197 Diarrhea, unspecified: Secondary | ICD-10-CM

## 2023-06-08 DIAGNOSIS — B349 Viral infection, unspecified: Secondary | ICD-10-CM | POA: Diagnosis not present

## 2023-06-08 NOTE — ED Provider Notes (Signed)
[facility]    CSN: [id number] Arrival date & time: 06/08/23  0901      History   Chief Complaint Chief Complaint  Patient presents with   Shortness of Breath   Diarrhea    HPI Butler Vegh is a 27 y.o. male.    Shortness of Breath Associated symptoms: cough and headaches   Associated symptoms: no abdominal pain, no chest pain, no diaphoresis, no ear pain, no fever, no sore throat, no vomiting and no wheezing   Diarrhea Associated symptoms: headaches   Associated symptoms: no abdominal pain, no chills, no diaphoresis, no fever and no vomiting   Not feeling well for 5 days symptoms include rhinorrhea, nasal congestion, postnasal drip, cough with occasional sputum, diarrhea frequent moderate 4-5 episodes a day triggered by meals, nausea.  Denies documented fever, chills, sweats, body aches, fatigue.  Admits recent contact with family members who have COVID.  Admits recent hospitalization for psychiatric reasons was discharged 12 days ago, admits new medications.  Past Medical History:  Diagnosis Date   Anxiety    Asthma    Depression     Patient Active Problem List   Diagnosis Date Noted   GAD (generalized anxiety disorder) 06/06/2023   Major depressive disorder, recurrent episode, moderate (HCC) 06/06/2023   Alcohol use disorder, moderate, in early remission (HCC) 06/06/2023   Adjustment disorder with mixed anxiety and depressed mood 05/27/2023   Insomnia 05/27/2023   Suicide attempt (HCC) 05/26/2023   Chest wall pain 01/13/2021   Epigastric abdominal pain 01/13/2021   Epigastric abdominal tenderness without rebound tenderness 01/13/2021   Mild tetrahydrocannabinol (THC) abuse 01/13/2021   Marijuana abuse 01/13/2021   Anxiety and depression 01/13/2021   Systolic murmur 05/09/2017   Sinus tachycardia by electrocardiogram 05/09/2017   Chest wall trauma, sequela 05/09/2017    Past Surgical History:  Procedure Laterality Date   CIRCUMCISION  1999        Home Medications    Prior to Admission medications   Medication Sig Start Date End Date Taking? Authorizing Provider  escitalopram (LEXAPRO) 10 MG tablet Take 1 tablet (10 mg total) by mouth daily. 06/05/23  Yes Toy Cookey E, NP  hydrOXYzine (ATARAX) 25 MG tablet Take 1 tablet (25 mg total) by mouth 3 (three) times daily as needed for anxiety. 06/05/23  Yes Toy Cookey E, NP  propranolol (INDERAL) 10 MG tablet Take 1 tablet (10 mg total) by mouth 2 (two) times daily. 06/05/23  Yes Toy Cookey E, NP  traZODone (DESYREL) 50 MG tablet Take 1 tablet (50 mg total) by mouth at bedtime as needed for sleep. 06/05/23  Yes Shanna Cisco, NP    Family History Family History  Problem Relation Age of Onset   Heart murmur Mother    Clotting disorder Father    Other Maternal Grandmother        Died at 20 due to blood clots   Diabetes Maternal Grandmother    Clotting disorder Paternal Grandfather        blood clot   Heart failure Paternal Grandfather    Heart attack Paternal Aunt     Social History Social History   Tobacco Use   Smoking status: Never    Passive exposure: Past   Smokeless tobacco: Never   Tobacco comments:    Marijuana  Vaping Use   Vaping status: Never Used  Substance Use Topics   Alcohol use: Not Currently    Alcohol/week: 2.0 standard drinks of alcohol  Types: 2 Cans of beer per week    Comment: daily   Drug use: Yes    Types: Marijuana     Allergies   Patient has no known allergies.   Review of Systems Review of Systems  Constitutional:  Positive for appetite change. Negative for chills, diaphoresis, fatigue and fever.  HENT:  Positive for postnasal drip and rhinorrhea. Negative for ear pain, sinus pressure and sore throat.   Respiratory:  Positive for cough and shortness of breath. Negative for chest tightness and wheezing.   Cardiovascular:  Negative for chest pain and palpitations.  Gastrointestinal:  Positive for  diarrhea and nausea. Negative for abdominal pain and vomiting.  Neurological:  Positive for headaches.     Physical Exam Triage Vital Signs ED Triage Vitals  Encounter Vitals Group     BP 06/08/23 0910 132/76     Systolic BP Percentile --      Diastolic BP Percentile --      Pulse Rate 06/08/23 0910 (!) 49     Resp 06/08/23 0910 18     Temp 06/08/23 0910 98.5 F (36.9 C)     Temp Source 06/08/23 0910 Oral     SpO2 06/08/23 0910 98 %     Weight --      Height --      Head Circumference --      Peak Flow --      Pain Score 06/08/23 0917 0     Pain Loc --      Pain Education --      Exclude from Growth Chart --    No data found.  Updated Vital Signs BP 132/76 (BP Location: Right Arm)   Pulse 68   Temp 98.5 F (36.9 C) (Oral)   Resp 18   SpO2 98%   Visual Acuity Right Eye Distance:   Left Eye Distance:   Bilateral Distance:    Right Eye Near:   Left Eye Near:    Bilateral Near:     Physical Exam Vitals reviewed.  Constitutional:      Appearance: He is not ill-appearing.     Interventions: He is not intubated. HENT:     Head: Normocephalic and atraumatic.     Mouth/Throat:     Mouth: Mucous membranes are moist.  Cardiovascular:     Rate and Rhythm: Normal rate.  Pulmonary:     Effort: Pulmonary effort is normal. No tachypnea, accessory muscle usage or respiratory distress. He is not intubated.     Breath sounds: Normal breath sounds. No decreased breath sounds, wheezing or rales.  Abdominal:     General: Bowel sounds are normal.     Palpations: Abdomen is soft.     Tenderness: There is no abdominal tenderness. There is no guarding or rebound.  Skin:    General: Skin is warm and dry.  Neurological:     Mental Status: He is alert.      UC Treatments / Results  Labs (all labs ordered are listed, but only abnormal results are displayed) Labs Reviewed - No data to display  EKG   Radiology No results found.  Procedures Procedures (including  critical care time)  Medications Ordered in UC Medications - No data to display  Initial Impression / Assessment and Plan / UC Course  I have reviewed the triage vital signs and the nursing notes.  Pertinent labs & imaging results that were available during my care of the patient were reviewed by me and  considered in my medical decision making (see chart for details).     27 year old male with URI symptoms and diarrhea for 5 days, recent COVID exposure.  Reports productive cough and feeling SOB. Recent hospitalization and medications.Well appearing, VSS, benign exam.Lungs CTA, normal respiratory effort, normal O2 saturation.  Recommend home COVID test. Home management of respiratory symptoms and diarrhea reviewed.   Final Clinical Impressions(s) / UC Diagnoses   Final diagnoses:  Viral syndrome  Cough with exposure to COVID-19 virus  Diarrhea, unspecified type     Discharge Instructions      Recommend home COVID test     ED Prescriptions   None    PDMP not reviewed this encounter.   Meliton Rattan, [place] 06/08/23 [phone]

## 2023-06-08 NOTE — ED Triage Notes (Signed)
SOB, diarrhea, since Monday. Was at a golf tournament Saturday, was exposed to Covid. Reports his parents also were there and tested positive for Covid on Monday night. Has not used any OTC meds to treat symptoms. No at-home testing performed. Reports chills, didn't check to see if he had a fever. Reports headache and generalized body aches, nausea. Denies cough, sore throat, rash, ear pain, facial pain, abdominal pain, vomiting.

## 2023-06-08 NOTE — Discharge Instructions (Addendum)
Recommend home COVID test

## 2023-06-11 ENCOUNTER — Ambulatory Visit (HOSPITAL_COMMUNITY): Payer: Medicaid Other | Admitting: Psychiatry

## 2023-06-12 ENCOUNTER — Ambulatory Visit
Admission: EM | Admit: 2023-06-12 | Discharge: 2023-06-12 | Disposition: A | Discharge status: Home / Self Care | Payer: Medicaid Other | Attending: Internal Medicine | Admitting: Internal Medicine

## 2023-06-12 DIAGNOSIS — R051 Acute cough: Secondary | ICD-10-CM | POA: Insufficient documentation

## 2023-06-12 DIAGNOSIS — Z1152 Encounter for screening for COVID-19: Secondary | ICD-10-CM | POA: Diagnosis not present

## 2023-06-12 DIAGNOSIS — B349 Viral infection, unspecified: Secondary | ICD-10-CM | POA: Insufficient documentation

## 2023-06-12 MED ORDER — ONDANSETRON 4 MG PO TBDP
4.0000 mg | ORAL_TABLET | Freq: Three times a day (TID) | ORAL | 0 refills | Status: AC | PRN
Start: 2023-06-12 — End: ?

## 2023-06-12 NOTE — ED Provider Notes (Signed)
[facility]    CSN: [id number] Arrival date & time: 06/12/23  7829      History   Chief Complaint No chief complaint on file.   HPI Ryan [name] is a 27 y.o. male  presents for evaluation of URI symptoms for 3 days. Patient reports associated symptoms of cough, congestion, sore throat, low grade fevers, nonbloody vomiting and diarrhea. Denies ear pain, body aches, shortness of breath. Patient does have a hx of asthma.  Has an albuterol inhaler.  Patient does have a history of smoking marijuana and states he quit 2 weeks ago.  Reports  sick contacts via his daughter.  Pt has taken Tylenol OTC for symptoms. Pt has no other concerns at this time.   HPI  Past Medical History:  Diagnosis Date   Anxiety    Asthma    Depression     Patient Active Problem List   Diagnosis Date Noted   GAD (generalized anxiety disorder) 06/06/2023   Major depressive disorder, recurrent episode, moderate (HCC) 06/06/2023   Alcohol use disorder, moderate, in early remission (HCC) 06/06/2023   Adjustment disorder with mixed anxiety and depressed mood 05/27/2023   Insomnia 05/27/2023   Suicide attempt (HCC) 05/26/2023   Chest wall pain 01/13/2021   Epigastric abdominal pain 01/13/2021   Epigastric abdominal tenderness without rebound tenderness 01/13/2021   Mild tetrahydrocannabinol (THC) abuse 01/13/2021   Marijuana abuse 01/13/2021   Anxiety and depression 01/13/2021   Systolic murmur 05/09/2017   Sinus tachycardia by electrocardiogram 05/09/2017   Chest wall trauma, sequela 05/09/2017    Past Surgical History:  Procedure Laterality Date   CIRCUMCISION  1999       Home Medications    Prior to Admission medications   Medication Sig Start Date End Date Taking? Authorizing Provider  ondansetron (ZOFRAN-ODT) 4 MG disintegrating tablet Take 1 tablet (4 mg total) by mouth every 8 (eight) hours as needed for nausea or vomiting. 06/12/23  Yes Radford Pax, NP  escitalopram  (LEXAPRO) 10 MG tablet Take 1 tablet (10 mg total) by mouth daily. 06/05/23   Shanna Cisco, NP  hydrOXYzine (ATARAX) 25 MG tablet Take 1 tablet (25 mg total) by mouth 3 (three) times daily as needed for anxiety. 06/05/23   Shanna Cisco, NP  propranolol (INDERAL) 10 MG tablet Take 1 tablet (10 mg total) by mouth 2 (two) times daily. 06/05/23   Shanna Cisco, NP  traZODone (DESYREL) 50 MG tablet Take 1 tablet (50 mg total) by mouth at bedtime as needed for sleep. 06/05/23   Shanna Cisco, NP    Family History Family History  Problem Relation Age of Onset   Heart murmur Mother    Clotting disorder Father    Other Maternal Grandmother        Died at 55 due to blood clots   Diabetes Maternal Grandmother    Clotting disorder Paternal Grandfather        blood clot   Heart failure Paternal Grandfather    Heart attack Paternal Aunt     Social History Social History   Tobacco Use   Smoking status: Never    Passive exposure: Past   Smokeless tobacco: Never   Tobacco comments:    Marijuana  Vaping Use   Vaping status: Never Used  Substance Use Topics   Alcohol use: Not Currently    Alcohol/week: 2.0 standard drinks of alcohol    Types: 2 Cans of beer per week  Comment: daily   Drug use: Yes    Types: Marijuana     Allergies   Patient has no known allergies.   Review of Systems Review of Systems  Constitutional:  Positive for fever.  HENT:  Positive for congestion and sore throat.   Respiratory:  Positive for cough.   Gastrointestinal:  Positive for diarrhea, nausea and vomiting.     Physical Exam Triage Vital Signs ED Triage Vitals  Encounter Vitals Group     BP 06/12/23 0834 114/70     Systolic BP Percentile --      Diastolic BP Percentile --      Pulse Rate 06/12/23 0834 60     Resp 06/12/23 0834 16     Temp 06/12/23 0834 (!) 97.3 F (36.3 C)     Temp Source 06/12/23 0834 Oral     SpO2 06/12/23 0834 96 %     Weight --      Height --       Head Circumference --      Peak Flow --      Pain Score 06/12/23 0837 2     Pain Loc --      Pain Education --      Exclude from Growth Chart --    No data found.  Updated Vital Signs BP 114/70 (BP Location: Right Arm)   Pulse 60   Temp (!) 97.3 F (36.3 C) (Oral)   Resp 16   SpO2 96%   Visual Acuity Right Eye Distance:   Left Eye Distance:   Bilateral Distance:    Right Eye Near:   Left Eye Near:    Bilateral Near:     Physical Exam Vitals and nursing note reviewed.  Constitutional:      General: He is not in acute distress.    Appearance: Normal appearance. He is not ill-appearing or toxic-appearing.  HENT:     Head: Normocephalic and atraumatic.     Right Ear: Tympanic membrane and ear canal normal.     Left Ear: Tympanic membrane and ear canal normal.     Nose: Congestion present.     Mouth/Throat:     Mouth: Mucous membranes are moist.     Pharynx: No oropharyngeal exudate or posterior oropharyngeal erythema.  Eyes:     Pupils: Pupils are equal, round, and reactive to light.  Cardiovascular:     Rate and Rhythm: Normal rate and regular rhythm.     Heart sounds: Normal heart sounds.  Pulmonary:     Effort: Pulmonary effort is normal.     Breath sounds: Normal breath sounds.  Abdominal:     General: Bowel sounds are normal. There is no distension.     Palpations: Abdomen is soft.     Tenderness: There is no abdominal tenderness. There is no right CVA tenderness, left CVA tenderness, guarding or rebound.  Musculoskeletal:     Cervical back: Normal range of motion and neck supple.  Lymphadenopathy:     Cervical: No cervical adenopathy.  Skin:    General: Skin is warm and dry.  Neurological:     General: No focal deficit present.     Mental Status: He is alert and oriented to person, place, and time.  Psychiatric:        Mood and Affect: Mood normal.        Behavior: Behavior normal.      UC Treatments / Results  Labs (all labs ordered are  listed, but only  abnormal results are displayed) Labs Reviewed  SARS CORONAVIRUS 2 (TAT 6-24 HRS)    EKG   Radiology No results found.  Procedures Procedures (including critical care time)  Medications Ordered in UC Medications - No data to display  Initial Impression / Assessment and Plan / UC Course  I have reviewed the triage vital signs and the nursing notes.  Pertinent labs & imaging results that were available during my care of the patient were reviewed by me and considered in my medical decision making (see chart for details).     Reviewed exam and symptoms with patient.  No red flags.  He is well-appearing and in no acute distress.  COVID PCR and will contact if positive.  Discussed viral testing symptomatic treatment.  Zofran as needed nausea/vomiting discussed fluids/electrolyte replacement.  PCP follow-up if symptoms do not improve.  ER precautions reviewed and patient verbalized understanding. Final Clinical Impressions(s) / UC Diagnoses   Final diagnoses:  Acute cough  Viral illness     Discharge Instructions      The clinic will contact you with results of the COVID test done today if positive.  May take Zofran as needed for nausea or vomiting.  Please eat a bland diet and advance as you tolerate.  Lots of rest and fluids/electrolyte replacement with Gatorade, Powerade, Pedialyte.  Please follow-up with your PCP in 2 days for recheck.  Please go to the ER for any worsening symptoms.  I hope you feel better soon!   ED Prescriptions     Medication Sig Dispense Auth. Provider   ondansetron (ZOFRAN-ODT) 4 MG disintegrating tablet Take 1 tablet (4 mg total) by mouth every 8 (eight) hours as needed for nausea or vomiting. 10 tablet Radford Pax, NP      PDMP not reviewed this encounter.   Radford Pax, NP 06/12/23 [phone]

## 2023-06-12 NOTE — ED Triage Notes (Signed)
Pt presents to UC w/ c/o vomiting and diarrhea x3 days, fever over the weekend. Needs doctors note.

## 2023-06-12 NOTE — Discharge Instructions (Signed)
The clinic will contact you with results of the COVID test done today if positive.  May take Zofran as needed for nausea or vomiting.  Please eat a bland diet and advance as you tolerate.  Lots of rest and fluids/electrolyte replacement with Gatorade, Powerade, Pedialyte.  Please follow-up with your PCP in 2 days for recheck.  Please go to the ER for any worsening symptoms.  I hope you feel better soon!

## 2023-06-13 LAB — SARS CORONAVIRUS 2 (TAT 6-24 HRS): SARS Coronavirus 2: NEGATIVE

## 2023-06-14 ENCOUNTER — Ambulatory Visit (HOSPITAL_COMMUNITY): Payer: Medicaid Other

## 2023-07-12 ENCOUNTER — Telehealth (HOSPITAL_COMMUNITY): Payer: Self-pay

## 2023-07-12 ENCOUNTER — Ambulatory Visit (HOSPITAL_COMMUNITY): Payer: Medicaid Other

## 2023-07-12 ENCOUNTER — Encounter (HOSPITAL_COMMUNITY): Payer: Self-pay

## 2023-07-12 NOTE — Telephone Encounter (Signed)
Sent a No show letter to Ryan [name] informing him that he missed his appointment with this therapist today and if he would like to schedule, he needs to call the number listed in the letter.  Remigio Eisenmenger, MS., LMFT, LCAS

## 2023-07-25 ENCOUNTER — Ambulatory Visit (HOSPITAL_COMMUNITY): Payer: Medicaid Other | Admitting: Licensed Clinical Social Worker

## 2023-08-28 ENCOUNTER — Encounter (HOSPITAL_COMMUNITY): Payer: Self-pay | Admitting: Psychiatry

## 2023-08-28 ENCOUNTER — Ambulatory Visit (INDEPENDENT_AMBULATORY_CARE_PROVIDER_SITE_OTHER): Payer: Medicaid Other | Admitting: Psychiatry

## 2023-08-28 ENCOUNTER — Other Ambulatory Visit (HOSPITAL_COMMUNITY): Payer: Self-pay

## 2023-08-28 DIAGNOSIS — F32A Depression, unspecified: Secondary | ICD-10-CM

## 2023-08-28 DIAGNOSIS — F411 Generalized anxiety disorder: Secondary | ICD-10-CM | POA: Diagnosis not present

## 2023-08-28 DIAGNOSIS — Z0000 Encounter for general adult medical examination without abnormal findings: Secondary | ICD-10-CM | POA: Diagnosis not present

## 2023-08-28 DIAGNOSIS — F418 Other specified anxiety disorders: Secondary | ICD-10-CM

## 2023-08-28 MED ORDER — ESCITALOPRAM OXALATE 20 MG PO TABS: 20.0000 mg | ORAL_TABLET | Freq: Every day | ORAL | 3 refills | Status: DC

## 2023-08-28 MED ORDER — HYDROXYZINE HCL 25 MG PO TABS: 25.0000 mg | ORAL_TABLET | Freq: Three times a day (TID) | ORAL | 3 refills | Status: DC | PRN

## 2023-08-28 MED ORDER — PROPRANOLOL HCL 10 MG PO TABS
10.0000 mg | ORAL_TABLET | Freq: Two times a day (BID) | ORAL | 3 refills | Status: DC
Start: 2023-08-28 — End: 2023-10-31

## 2023-08-28 MED ORDER — TRAZODONE HCL 50 MG PO TABS: 50.0000 mg | ORAL_TABLET | Freq: Every evening | ORAL | 3 refills | Status: DC | PRN
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
## 2023-10-21 ENCOUNTER — Ambulatory Visit
Admission: EM | Admit: 2023-10-21 | Discharge: 2023-10-21 | Disposition: A | Discharge status: Home / Self Care | Payer: Medicaid Other | Attending: Family Medicine | Admitting: Family Medicine

## 2023-10-21 DIAGNOSIS — R6889 Other general symptoms and signs: Secondary | ICD-10-CM

## 2023-10-21 DIAGNOSIS — J45901 Unspecified asthma with (acute) exacerbation: Secondary | ICD-10-CM

## 2023-10-21 DIAGNOSIS — K920 Hematemesis: Secondary | ICD-10-CM

## 2023-10-21 MED ORDER — PREDNISONE 20 MG PO TABS
40.0000 mg | ORAL_TABLET | Freq: Every day | ORAL | 0 refills | Status: AC
Start: 2023-10-21 — End: 2023-10-26

## 2023-10-21 MED ORDER — ALBUTEROL SULFATE HFA 108 (90 BASE) MCG/ACT IN AERS
1.0000 | INHALATION_SPRAY | Freq: Four times a day (QID) | RESPIRATORY_TRACT | 0 refills | Status: AC | PRN
Start: 2023-10-21 — End: ?

## 2023-10-21 NOTE — Discharge Instructions (Addendum)
 I refilled your albuterol  inhaler to use as needed for wheezing or shortness of breath.  You may start prednisone  daily for 5 days time with your asthma symptoms.  Continue your previously prescribed Tessalon  as needed for cough.  Lots of fluids and rest.  I do recommend that you go to the emergency room for further workup of your reported vomiting of blood.  Please follow-up with your PCP in 2 days for recheck.  I do hope you feel better soon!

## 2023-10-21 NOTE — ED Provider Notes (Signed)
 [facility]    CSN: [id number] Arrival date & time: 10/21/23  9046      History   Chief Complaint Chief Complaint  Patient presents with   Emesis   Fever    HPI Ryan [name] is a 28 y.o. male  presents for evaluation of URI symptoms for 3 days. Patient reports associated symptoms of cough, congestion, fever, vomiting, wheezing. Denies diarrhea, ear pain, sore throat, body aches, shortness of breath. Patient does have a hx of asthma. Patient is an active smoker.   Reports states his kids had the flu earlier this week.  Pt has taken Tylenol  OTC for symptoms.  He does report he has vomited about 3 times a night every night since onset of symptoms and says at least 8 out of 9 of those episodes there was blood in the vomit.  He states it was a darker tinged blood and accounted for about 20% of the vomit.  Denies any rectal bleeding or blood in stool.  Denies any history of PUD or ulcers.  He is not on blood thinning medications.  Pt has no other concerns at this time.    Emesis Associated symptoms: cough and fever   Fever Associated symptoms: congestion, cough and vomiting     Past Medical History:  Diagnosis Date   Anxiety    Asthma    Depression     Patient Active Problem List   Diagnosis Date Noted   GAD (generalized anxiety disorder) 06/06/2023   Major depressive disorder, recurrent episode, moderate (HCC) 06/06/2023   Alcohol use disorder, moderate, in early remission (HCC) 06/06/2023   Adjustment disorder with mixed anxiety and depressed mood 05/27/2023   Insomnia 05/27/2023   Suicide attempt (HCC) 05/26/2023   Chest wall pain 01/13/2021   Epigastric abdominal pain 01/13/2021   Epigastric abdominal tenderness without rebound tenderness 01/13/2021   Mild tetrahydrocannabinol (THC) abuse 01/13/2021   Marijuana abuse 01/13/2021   Anxiety and depression 01/13/2021   Systolic murmur 05/09/2017   Sinus tachycardia by electrocardiogram 05/09/2017   Chest  wall trauma, sequela 05/09/2017    Past Surgical History:  Procedure Laterality Date   CIRCUMCISION  1999       Home Medications    Prior to Admission medications   Medication Sig Start Date End Date Taking? Authorizing Provider  albuterol  (VENTOLIN  HFA) 108 (90 Base) MCG/ACT inhaler Inhale 1-2 puffs into the lungs every 6 (six) hours as needed for wheezing or shortness of breath. 10/21/23  Yes Elowyn Raupp, Jodi R, NP  predniSONE  (DELTASONE ) 20 MG tablet Take 2 tablets (40 mg total) by mouth daily with breakfast for 5 days. 10/21/23 10/26/23 Yes Darrell Leonhardt, Jodi R, NP  escitalopram  (LEXAPRO ) 20 MG tablet Take 1 tablet (20 mg total) by mouth daily. 08/28/23   Harl Zane BRAVO, NP  hydrOXYzine  (ATARAX ) 25 MG tablet Take 1 tablet (25 mg total) by mouth 3 (three) times daily as needed for anxiety. 08/28/23   Harl Zane BRAVO, NP  ondansetron  (ZOFRAN -ODT) 4 MG disintegrating tablet Take 1 tablet (4 mg total) by mouth every 8 (eight) hours as needed for nausea or vomiting. 06/12/23   Ronnita Paz, Jodi R, NP  propranolol  (INDERAL ) 10 MG tablet Take 1 tablet (10 mg total) by mouth 2 (two) times daily. 08/28/23   Harl Zane BRAVO, NP  traZODone  (DESYREL ) 50 MG tablet Take 1 tablet (50 mg total) by mouth at bedtime as needed for sleep. 08/28/23   Harl Zane BRAVO, NP    Family History  Family History  Problem Relation Age of Onset   Heart murmur Mother    Clotting disorder Father    Other Maternal Grandmother        Died at 100 due to blood clots   Diabetes Maternal Grandmother    Clotting disorder Paternal Grandfather        blood clot   Heart failure Paternal Grandfather    Heart attack Paternal Aunt     Social History Social History   Tobacco Use   Smoking status: Never    Passive exposure: Past   Smokeless tobacco: Never   Tobacco comments:    Marijuana  Vaping Use   Vaping status: Never Used  Substance Use Topics   Alcohol use: Not Currently    Alcohol/week: 2.0 standard drinks of  alcohol    Types: 2 Cans of beer per week    Comment: daily   Drug use: Yes    Types: Marijuana     Allergies   Patient has no known allergies.   Review of Systems Review of Systems  Constitutional:  Positive for fever.  HENT:  Positive for congestion.   Respiratory:  Positive for cough.   Gastrointestinal:  Positive for vomiting.     Physical Exam Triage Vital Signs ED Triage Vitals  Encounter Vitals Group     BP 10/21/23 1042 132/75     Systolic BP Percentile --      Diastolic BP Percentile --      Pulse Rate 10/21/23 1042 65     Resp 10/21/23 1042 18     Temp 10/21/23 1042 99.2 F (37.3 C)     Temp Source 10/21/23 1042 Oral     SpO2 10/21/23 1042 98 %     Weight --      Height --      Head Circumference --      Peak Flow --      Pain Score 10/21/23 1041 4     Pain Loc --      Pain Education --      Exclude from Growth Chart --    No data found.  Updated Vital Signs BP 132/75 (BP Location: Right Arm)   Pulse 65   Temp 99.2 F (37.3 C) (Oral)   Resp 18   SpO2 98%   Visual Acuity Right Eye Distance:   Left Eye Distance:   Bilateral Distance:    Right Eye Near:   Left Eye Near:    Bilateral Near:     Physical Exam Vitals and nursing note reviewed.  Constitutional:      General: He is not in acute distress.    Appearance: Normal appearance. He is not ill-appearing or toxic-appearing.  HENT:     Head: Normocephalic and atraumatic.     Right Ear: Tympanic membrane and ear canal normal.     Left Ear: Tympanic membrane and ear canal normal.     Nose: Congestion present.     Mouth/Throat:     Mouth: Mucous membranes are moist.     Pharynx: No posterior oropharyngeal erythema.  Eyes:     Pupils: Pupils are equal, round, and reactive to light.  Cardiovascular:     Rate and Rhythm: Normal rate and regular rhythm.     Heart sounds: Normal heart sounds.  Pulmonary:     Effort: Pulmonary effort is normal. No respiratory distress.     Breath sounds:  Normal breath sounds. No stridor. No wheezing or rhonchi.  Musculoskeletal:  Cervical back: Normal range of motion and neck supple.  Lymphadenopathy:     Cervical: No cervical adenopathy.  Skin:    General: Skin is warm and dry.  Neurological:     General: No focal deficit present.     Mental Status: He is alert and oriented to person, place, and time.  Psychiatric:        Mood and Affect: Mood normal.        Behavior: Behavior normal.      UC Treatments / Results  Labs (all labs ordered are listed, but only abnormal results are displayed) Labs Reviewed - No data to display  EKG   Radiology No results found.  Procedures Procedures (including critical care time)  Medications Ordered in UC Medications - No data to display  Initial Impression / Assessment and Plan / UC Course  I have reviewed the triage vital signs and the nursing notes.  Pertinent labs & imaging results that were available during my care of the patient were reviewed by me and considered in my medical decision making (see chart for details).     Reviewed exam and symptoms with patient.  He declined flu testing as it would not change treatment given length of his symptoms.  Discussed hematemesis in length with patient.  Advise given he is reporting multiple episodes of this he should go to the emergency room for further evaluation.  He declines at this time stating if it happens again he will go.   Advised him to avoid any red dyed products/drinks over the next few days as well.  Refilled his albuterol  inhaler as well he can start prednisone  daily for 5 days for his asthma.  He has a prescription for Tessalon  which she will continue as needed for cough.  Advised fluids and rest.  PCP follow-up 2 days for recheck.  Again ER was encouraged/precautions reviewed and patient and wife verbalized understanding. Final Clinical Impressions(s) / UC Diagnoses   Final diagnoses:  Flu-like symptoms  Mild asthma with  acute exacerbation, unspecified whether persistent  Hematemesis, unspecified whether nausea present     Discharge Instructions      I refilled your albuterol  inhaler to use as needed for wheezing or shortness of breath.  You may start prednisone  daily for 5 days time with your asthma symptoms.  Continue your previously prescribed Tessalon  as needed for cough.  Lots of fluids and rest.  I do recommend that you go to the emergency room for further workup of your reported vomiting of blood.  Please follow-up with your PCP in 2 days for recheck.  I do hope you feel better soon!    ED Prescriptions     Medication Sig Dispense Auth. Provider   albuterol  (VENTOLIN  HFA) 108 (90 Base) MCG/ACT inhaler Inhale 1-2 puffs into the lungs every 6 (six) hours as needed for wheezing or shortness of breath. 1 each Kylee Umana, Jodi R, NP   predniSONE  (DELTASONE ) 20 MG tablet Take 2 tablets (40 mg total) by mouth daily with breakfast for 5 days. 10 tablet Karmah Potocki, Jodi R, NP      PDMP not reviewed this encounter.   Loreda Myla SAUNDERS, NP 10/21/23 1105

## 2023-10-21 NOTE — ED Triage Notes (Signed)
 Pt present with c/o fever, cough x 3 days. C/o vomiting blood x 2 nights. Pt has taken tylenol , last take last night.

## 2023-10-31 ENCOUNTER — Encounter (HOSPITAL_COMMUNITY): Payer: Self-pay | Admitting: Psychiatry

## 2023-10-31 ENCOUNTER — Telehealth (INDEPENDENT_AMBULATORY_CARE_PROVIDER_SITE_OTHER): Payer: Medicaid Other | Admitting: Psychiatry

## 2023-10-31 DIAGNOSIS — F32A Depression, unspecified: Secondary | ICD-10-CM | POA: Diagnosis not present

## 2023-10-31 DIAGNOSIS — F411 Generalized anxiety disorder: Secondary | ICD-10-CM | POA: Diagnosis not present

## 2023-10-31 DIAGNOSIS — F1090 Alcohol use, unspecified, uncomplicated: Secondary | ICD-10-CM | POA: Diagnosis not present

## 2023-10-31 DIAGNOSIS — Z789 Other specified health status: Secondary | ICD-10-CM

## 2023-10-31 DIAGNOSIS — Z0000 Encounter for general adult medical examination without abnormal findings: Secondary | ICD-10-CM

## 2023-10-31 MED ORDER — PROPRANOLOL HCL 10 MG PO TABS: 10.0000 mg | ORAL_TABLET | Freq: Two times a day (BID) | ORAL | 3 refills | Status: AC

## 2023-10-31 MED ORDER — ESCITALOPRAM OXALATE 20 MG PO TABS: 20.0000 mg | ORAL_TABLET | Freq: Every day | ORAL | 3 refills | Status: AC

## 2023-10-31 MED ORDER — HYDROXYZINE HCL 25 MG PO TABS: 25.0000 mg | ORAL_TABLET | Freq: Three times a day (TID) | ORAL | 3 refills | Status: AC | PRN

## 2023-10-31 NOTE — Progress Notes (Signed)
 [facility] MD/PA/NP OP Progress Note Virtual Visit via Video Note  I connected with Ryan [name] on 10/31/23 at  2:30 PM EST by a video enabled telemedicine application and verified that I am speaking with the correct person using two identifiers.  Location: Patient: Home Provider: Clinic   I discussed the limitations of evaluation and management by telemedicine and the availability of in person appointments. The patient expressed understanding and agreed to proceed.  I provided 30 minutes of non-face-to-face time during this encounter.   10/31/2023 2:56 PM Ryan [name]  MRN:  [id number]  Chief Complaint: I quit smoking but I need to stop drinking."  HPI: 28 year old male seen today for follow up psychiatric evaluation.  He has a psychiatric history of anxiety, depression, adjustment disorder, and SI/SA.  Patient is currently managed on Lexapro 20 mg daily, hydroxyzine 25 mg 3 times daily, propranolol 10 mg twice daily, and trazodone 50 mg nightly as needed.  Patient reports he discontinued trazodone and reports that his other medications are effective for managing his psychiatric conditions.     Today he was well-groomed, pleasant, cooperative, engaged in conversation.  He informed Clinical research associate that he stopped smoking marijuana however notes that he continues to drink alcohol.  Patient informed Clinical research associate that he wants to stop drinking as he is on the verge of losing his family.  Patient notes that he and his wife has been getting into arguments over his drinking.  At patient last visit provider recommended naltrexone to help reduce patient's cravings.  Provider informed patient that he would need to take a UDS to see if opioids was in his system.  He notes that he is willing to try this.  He also notes that he would like to start SA IOP.  Provider reached out to Ms. Remigio Eisenmenger and was able to set an appointment for November 05, 2023 at 9:00.    Since increasing Lexapro patient notes that he no  longer feels that he is in a dark hole.  He reports that he is able to deal with stressors more.  Today provider conducted GAD-7 and he scored a 16, at his last visit he scored a 7.  Provider also conducted PHQ-9 P scored a 14, at his last visit he scored a 12.  Patient notes that he discontinued trazodone and is sleeping 7 hours.  Today he denies SI/HI/VAH, mania, or paranoia.  Patient does note that he is irritable and at times have racing thoughts.   Today provider conducted an audit assessment and patient scored a 23, at his last visit he scored a 24.     Patient does report he is interested in naltrexone.  Provider ordered UDS, CMP, LFT, thyroid panel, CBC, and lipid panel.  Provider informed patient that he cannot take naltrexone with opioids in his system.  He endorsed understanding and agreed.  Provider informed patient that naltrexone will be ordered after his results were reviewed.  He endorsed understanding and agreed.  Provider informed patient that in mood stabilizer, antipsychotic could be trialed in the future if his depression and mood does not improve.  Provider encouraged patient to research Abilify and Rexulti.  At this time he does not wish to continue trazodone.  Patient follow-up therapy.  No other concerns at this time.    Visit Diagnosis:    ICD-10-CM   1. Alcohol use  Z78.9 CBC with Differential/Platelet    Hepatic function panel    Thyroid Panel With TSH  Lipid panel    Urine Drug Panel 7    2. Generalized anxiety disorder  F41.1 escitalopram (LEXAPRO) 20 MG tablet    3. Mild depression  F32.A escitalopram (LEXAPRO) 20 MG tablet    propranolol (INDERAL) 10 MG tablet    4. Well adult exam  Z00.00 hydrOXYzine (ATARAX) 25 MG tablet       Past Psychiatric History: Anxiety, depression, adjustment disorder, and SI/SA   Past Medical History:  Past Medical History:  Diagnosis Date   Anxiety    Asthma    Depression     Past Surgical History:  Procedure Laterality  Date   CIRCUMCISION  1999    Family Psychiatric History: Mother Bipolar disorder, alcohol, marijuana use and depression, depression brother, brothers alcohol and marijuana use, father alcohol and marijuana use   Family History:  Family History  Problem Relation Age of Onset   Heart murmur Mother    Clotting disorder Father    Other Maternal Grandmother        Died at 4 due to blood clots   Diabetes Maternal Grandmother    Clotting disorder Paternal Grandfather        blood clot   Heart failure Paternal Grandfather    Heart attack Paternal Aunt     Social History:  Social History   Socioeconomic History   Marital status: Married    Spouse name: Not on file   Number of children: Not on file   Years of education: Not on file   Highest education level: Not on file  Occupational History   Not on file  Tobacco Use   Smoking status: Never    Passive exposure: Past   Smokeless tobacco: Never   Tobacco comments:    Marijuana  Vaping Use   Vaping status: Never Used  Substance and Sexual Activity   Alcohol use: Not Currently    Alcohol/week: 2.0 standard drinks of alcohol    Types: 2 Cans of beer per week    Comment: daily   Drug use: Yes    Types: Marijuana   Sexual activity: Yes    Birth control/protection: None  Other Topics Concern   Not on file  Social History Narrative   Works at [employer] for 4 months, loves his job.   Social Drivers of Health   Financial Resource Strain: High Risk (06/06/2023)   Overall Financial Resource Strain (CARDIA)    Difficulty of Paying Living Expenses: Hard  Food Insecurity: No Food Insecurity (05/26/2023)   Hunger Vital Sign    Worried About Running Out of Food in the Last Year: Never true    Ran Out of Food in the Last Year: Never true  Transportation Needs: No Transportation Needs (05/26/2023)   PRAPARE - Administrator, Civil Service (Medical): No    Lack of Transportation (Non-Medical): No  Physical Activity:  Sufficiently Active (06/06/2023)   Exercise Vital Sign    Days of Exercise per Week: 7 days    Minutes of Exercise per Session: 120 min  Stress: Stress Concern Present (06/06/2023)   [employer] of Occupational Health - Occupational Stress Questionnaire    Feeling of Stress : Very much  Social Connections: Moderately Isolated (06/06/2023)   Social Connection and Isolation Panel [NHANES]    Frequency of Communication with Friends and Family: Twice a week    Frequency of Social Gatherings with Friends and Family: Never    Attends Religious Services: More than 4 times per year  Active Member of Clubs or Organizations: No    Attends Banker Meetings: Never    Marital Status: Living with partner    Allergies: No Known Allergies  Metabolic Disorder Labs: Lab Results  Component Value Date   HGBA1C 5.0 05/28/2023   MPG 97 05/28/2023   No results found for: "PROLACTIN" Lab Results  Component Value Date   CHOL 189 05/28/2023   TRIG 78 05/28/2023   HDL 74 05/28/2023   CHOLHDL 2.6 05/28/2023   VLDL 16 05/28/2023   LDLCALC 99 05/28/2023   Lab Results  Component Value Date   TSH 2.230 05/28/2023   TSH 1.026 01/11/2021    Therapeutic Level Labs: No results found for: "LITHIUM" No results found for: "VALPROATE" No results found for: "CBMZ"  Current Medications: Current Outpatient Medications  Medication Sig Dispense Refill   albuterol (VENTOLIN HFA) 108 (90 Base) MCG/ACT inhaler Inhale 1-2 puffs into the lungs every 6 (six) hours as needed for wheezing or shortness of breath. 1 each 0   escitalopram (LEXAPRO) 20 MG tablet Take 1 tablet (20 mg total) by mouth daily. 30 tablet 3   hydrOXYzine (ATARAX) 25 MG tablet Take 1 tablet (25 mg total) by mouth 3 (three) times daily as needed for anxiety. 90 tablet 3   ondansetron (ZOFRAN-ODT) 4 MG disintegrating tablet Take 1 tablet (4 mg total) by mouth every 8 (eight) hours as needed for nausea or vomiting. 10 tablet 0    propranolol (INDERAL) 10 MG tablet Take 1 tablet (10 mg total) by mouth 2 (two) times daily. 60 tablet 3   No current facility-administered medications for this visit.     Musculoskeletal: Strength & Muscle Tone: within normal limits telehealth visit Gait & Station: normal telehealth visit Patient leans: N/A  Psychiatric Specialty Exam: Review of Systems  There were no vitals taken for this visit.There is no height or weight on file to calculate BMI.  General Appearance: Well Groomed  Eye Contact:  Good  Speech:  Clear and Coherent and Normal Rate  Volume:  Normal  Mood:  Anxious and Depressed,  Affect:  Appropriate and Congruent  Thought Process:  Coherent, Goal Directed, and Linear  Orientation:  Full (Time, Place, and Person)  Thought Content: WDL and Logical   Suicidal Thoughts:  No  Homicidal Thoughts:  No  Memory:  Immediate;   Good Recent;   Good Remote;   Good  Judgement:  Good  Insight:  Good  Psychomotor Activity:  Increased  Concentration:  Concentration: Good and Attention Span: Good  Recall:  Good  Fund of Knowledge: Good  Language: Good  Akathisia:  No  Handed:  Right  AIMS (if indicated): not done  Assets:  Communication Skills Desire for Improvement Financial Resources/Insurance Housing Intimacy Physical Health Social Support Vocational/Educational  ADL's:  Intact  Cognition: WNL  Sleep:  Fair   Screenings: AUDIT    Flowsheet Row Video Visit from 10/31/2023 in [facility] Clinical Support from 08/28/2023 in [facility] Office Visit from 06/05/2023 in [facility]  Alcohol Use Disorder Identification Test Final Score (AUDIT) 23 24 12       GAD-7    Flowsheet Row Video Visit from 10/31/2023 in [facility] Clinical Support from 08/28/2023 in  [facility] Counselor from 06/06/2023 in [facility] Office Visit from 06/05/2023 in [facility]  Total GAD-7 Score 16 7 8  8  [id number]    Flowsheet Row Video Visit from 10/31/2023 in [facility] Clinical Support from 08/28/2023 in [facility] Counselor from 06/06/2023 in [facility] Office Visit from 06/05/2023 in [facility] Office Visit from 01/13/2021 in [facility]  PHQ-2 Total Score 3 2 2 2 6   PHQ-9 Total Score 14 12 11 11 20       Flowsheet Row ED from 10/21/2023 in [facility] at [facility]) ED from 06/12/2023 in [facility] at [facility] ED from 06/08/2023 in [facility] at [facility])  C-SSRS RISK CATEGORY No Risk Error: Question 6 not populated Error: Q3, 4, or 5 should not be populated when Q2 is No        Assessment and Plan: Patient endorses increased depression, increased alcohol use, and anxiety.  Patient does report he is interested in naltrexone.  Provider ordered UDS, CMP, LFT, thyroid panel, CBC, and lipid panel.  Provider informed patient that he cannot take naltrexone with opioids in his system.  He endorsed understanding and agreed.  Provider informed patient that naltrexone will be ordered after his results were reviewed.  He endorsed understanding and agreed.  Provider informed patient that in mood stabilizer, antipsychotic could be trialed in the future if his depression and mood does not improve.  Provider encouraged patient to research Abilify and Rexulti.  At this time he does not wish to continue trazodone. 1. Generalized anxiety disorder  Continue- escitalopram (LEXAPRO) 20 MG tablet; Take 1 tablet (20 mg total) by mouth daily.  Dispense: 30 tablet; Refill: 3  2. Mild depression  Continue- escitalopram (LEXAPRO) 20 MG tablet; Take 1 tablet (20 mg total) by mouth  daily.  Dispense: 30 tablet; Refill: 3 Continue- propranolol (INDERAL) 10 MG tablet; Take 1 tablet (10 mg total) by mouth 2 (two) times daily.  Dispense: 60 tablet; Refill: 3  3. Well adult exam  Continue- hydrOXYzine (ATARAX) 25 MG tablet; Take 1 tablet (25 mg total) by mouth 3 (three) times daily as needed for anxiety.  Dispense: 90 tablet; Refill: 3  4. Alcohol use (Primary)  - CBC with Differential/Platelet; Future - Hepatic function panel; Future - Thyroid Panel With TSH; Future - Lipid panel; Future - Urine Drug Panel 7; Future - Urine Drug Panel 7 - Lipid panel - Thyroid Panel With TSH - Hepatic function panel - CBC with Differential/Platelet     Collaboration of Care: Collaboration of Care: Other provider involved in patient's care [facility] PCP and [facility] staff  Patient/Guardian was advised Release of Information must be obtained prior to any record release in order to collaborate their care with an outside provider. Patient/Guardian was advised if they have not already done so to contact the registration department to sign all necessary forms in order for [place] to release information regarding their care.   Consent: Patient/Guardian gives verbal consent for treatment and assignment of benefits for services provided during this visit. Patient/Guardian expressed understanding and agreed to proceed.   Follow-up in 2 months Follow-up with Eliezer Mccoy, NP 10/31/2023, 2:56 PM

## 2023-11-05 ENCOUNTER — Encounter (HOSPITAL_COMMUNITY): Payer: Self-pay

## 2023-11-05 ENCOUNTER — Ambulatory Visit (INDEPENDENT_AMBULATORY_CARE_PROVIDER_SITE_OTHER): Payer: Medicaid Other

## 2023-11-05 DIAGNOSIS — F411 Generalized anxiety disorder: Secondary | ICD-10-CM

## 2023-11-05 DIAGNOSIS — F331 Major depressive disorder, recurrent, moderate: Secondary | ICD-10-CM

## 2023-11-05 DIAGNOSIS — F4310 Post-traumatic stress disorder, unspecified: Secondary | ICD-10-CM

## 2023-11-05 DIAGNOSIS — F1020 Alcohol dependence, uncomplicated: Secondary | ICD-10-CM

## 2023-11-05 DIAGNOSIS — F1290 Cannabis use, unspecified, uncomplicated: Secondary | ICD-10-CM
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
## 2023-11-15 ENCOUNTER — Ambulatory Visit (HOSPITAL_COMMUNITY): Payer: Medicaid Other

## 2023-11-15 ENCOUNTER — Telehealth (HOSPITAL_COMMUNITY): Payer: Self-pay

## 2023-11-15 NOTE — Telephone Encounter (Signed)
 Front desk sent a message saying Lamount called and could not make his therapy appointment today and wanted a call to reschedule. Therapist calls Jordan, however reached Voice Mail. Therapist leaves a HIPAA compliant voice mail requesting a return call.  Remigio Eisenmenger, MS, LMFT, LCAS 11-15-23

## 2024-01-23 ENCOUNTER — Telehealth (HOSPITAL_COMMUNITY): Payer: Medicaid Other | Admitting: Psychiatry

## 2024-01-23 ENCOUNTER — Encounter (HOSPITAL_COMMUNITY): Payer: Self-pay
